# Patient Record
Sex: Female | Born: 1950 | Race: Black or African American | Hispanic: No | Smoking: Former smoker
Health system: Southern US, Community
[De-identification: ages and names within clinical notes are randomized; demographics above are authoritative.]

## PROBLEM LIST (undated history)

## (undated) DIAGNOSIS — I10 Essential (primary) hypertension: Secondary | ICD-10-CM

## (undated) DIAGNOSIS — K5792 Diverticulitis of intestine, part unspecified, without perforation or abscess without bleeding: Secondary | ICD-10-CM

## (undated) DIAGNOSIS — Z8619 Personal history of other infectious and parasitic diseases: Secondary | ICD-10-CM

## (undated) DIAGNOSIS — J45909 Unspecified asthma, uncomplicated: Secondary | ICD-10-CM

## (undated) DIAGNOSIS — E785 Hyperlipidemia, unspecified: Secondary | ICD-10-CM

## (undated) DIAGNOSIS — M1990 Unspecified osteoarthritis, unspecified site: Secondary | ICD-10-CM

## (undated) DIAGNOSIS — F32A Depression, unspecified: Secondary | ICD-10-CM

## (undated) HISTORY — DX: Essential (primary) hypertension: I10

## (undated) HISTORY — DX: Unspecified asthma, uncomplicated: J45.909

## (undated) HISTORY — DX: Unspecified osteoarthritis, unspecified site: M19.90

## (undated) HISTORY — DX: Personal history of other infectious and parasitic diseases: Z86.19

## (undated) HISTORY — PX: LUMBAR SPINE SURGERY: SHX701

## (undated) HISTORY — PX: ROTATOR CUFF REPAIR: SHX139

## (undated) HISTORY — PX: OTHER SURGICAL HISTORY: SHX169

## (undated) HISTORY — PX: REDUCTION MAMMAPLASTY: SUR839

## (undated) HISTORY — PX: COLECTOMY: SHX59

## (undated) HISTORY — DX: Hyperlipidemia, unspecified: E78.5

## (undated) HISTORY — DX: Depression, unspecified: F32.A

## (undated) HISTORY — PX: ABDOMINAL HYSTERECTOMY: SHX81

## (undated) HISTORY — PX: COLONOSCOPY WITH PROPOFOL: SHX5780

## (undated) HISTORY — DX: Diverticulitis of intestine, part unspecified, without perforation or abscess without bleeding: K57.92

---

## 2018-05-17 ENCOUNTER — Other Ambulatory Visit: Payer: Self-pay | Admitting: Physician Assistant

## 2018-05-17 DIAGNOSIS — E041 Nontoxic single thyroid nodule: Secondary | ICD-10-CM

## 2018-05-17 DIAGNOSIS — R928 Other abnormal and inconclusive findings on diagnostic imaging of breast: Secondary | ICD-10-CM

## 2020-01-17 ENCOUNTER — Other Ambulatory Visit: Payer: Self-pay

## 2020-01-17 ENCOUNTER — Ambulatory Visit: Payer: Self-pay | Attending: Internal Medicine

## 2020-01-17 DIAGNOSIS — Z23 Encounter for immunization: Secondary | ICD-10-CM | POA: Insufficient documentation

## 2020-01-17 NOTE — Progress Notes (Signed)
   Covid-19 Vaccination Clinic  Name:  Jesenia Spera    MRN: [id number] DOB: 07-12-1951  01/17/2020  Ms. Brackins was observed post Covid-19 immunization for 15 minutes without incidence. She was provided with Vaccine Information Sheet and instruction to access the V-Safe system.   Ms. Briones was instructed to call 911 with any severe reactions post vaccine: Marland Kitchen Difficulty breathing  . Swelling of your face and throat  . A fast heartbeat  . A bad rash all over your body  . Dizziness and weakness    Immunizations Administered    Name Date Dose VIS Date Route   Moderna COVID-19 Vaccine 01/17/2020 11:54 AM 0.5 mL 11/05/2019 Intramuscular   Manufacturer: Moderna   Lot: [id number]   NDC: 89842-103-12

## 2020-02-03 DIAGNOSIS — E785 Hyperlipidemia, unspecified: Secondary | ICD-10-CM | POA: Diagnosis not present

## 2020-02-03 DIAGNOSIS — Z0001 Encounter for general adult medical examination with abnormal findings: Secondary | ICD-10-CM | POA: Diagnosis not present

## 2020-02-03 DIAGNOSIS — G629 Polyneuropathy, unspecified: Secondary | ICD-10-CM | POA: Diagnosis not present

## 2020-02-03 DIAGNOSIS — Z23 Encounter for immunization: Secondary | ICD-10-CM | POA: Diagnosis not present

## 2020-02-03 DIAGNOSIS — I10 Essential (primary) hypertension: Secondary | ICD-10-CM | POA: Diagnosis not present

## 2020-02-03 DIAGNOSIS — M1711 Unilateral primary osteoarthritis, right knee: Secondary | ICD-10-CM | POA: Insufficient documentation

## 2020-02-03 DIAGNOSIS — E1169 Type 2 diabetes mellitus with other specified complication: Secondary | ICD-10-CM | POA: Insufficient documentation

## 2020-02-03 DIAGNOSIS — I152 Hypertension secondary to endocrine disorders: Secondary | ICD-10-CM | POA: Insufficient documentation

## 2020-02-03 DIAGNOSIS — F5101 Primary insomnia: Secondary | ICD-10-CM | POA: Diagnosis not present

## 2020-02-10 ENCOUNTER — Ambulatory Visit: Payer: Self-pay

## 2020-02-17 ENCOUNTER — Ambulatory Visit: Payer: Medicare HMO | Attending: Internal Medicine

## 2020-02-17 DIAGNOSIS — Z23 Encounter for immunization: Secondary | ICD-10-CM

## 2020-02-17 NOTE — Progress Notes (Signed)
   Covid-19 Vaccination Clinic  Name:  Judy Porter    MRN: [id number] DOB: Jun 03, 1951  02/17/2020  [name] was observed post Covid-19 immunization for 15 minutes without incident. She was provided with Vaccine Information Sheet and instruction to access the V-Safe system.   [name] was instructed to call 911 with any severe reactions post vaccine: Marland Kitchen Difficulty breathing  . Swelling of face and throat  . A fast heartbeat  . A bad rash all over body  . Dizziness and weakness   Immunizations Administered    Name Date Dose VIS Date Route   Moderna COVID-19 Vaccine 02/17/2020 11:16 AM 0.5 mL 11/05/2019 Intramuscular   Manufacturer: Moderna   Lot: [id number]   NDC: 08022-336-12

## 2020-02-21 ENCOUNTER — Other Ambulatory Visit: Payer: Self-pay | Admitting: Orthopedic Surgery

## 2020-02-21 DIAGNOSIS — M1712 Unilateral primary osteoarthritis, left knee: Secondary | ICD-10-CM

## 2020-02-21 DIAGNOSIS — M84452A Pathological fracture, left femur, initial encounter for fracture: Secondary | ICD-10-CM | POA: Diagnosis not present

## 2020-02-21 DIAGNOSIS — M25562 Pain in left knee: Secondary | ICD-10-CM | POA: Diagnosis not present

## 2020-03-23 ENCOUNTER — Other Ambulatory Visit: Payer: Self-pay

## 2020-03-23 ENCOUNTER — Other Ambulatory Visit: Payer: Self-pay | Admitting: Orthopedic Surgery

## 2020-03-23 ENCOUNTER — Ambulatory Visit
Admission: RE | Admit: 2020-03-23 | Discharge: 2020-03-23 | Disposition: A | Discharge status: Home / Self Care | Payer: PPO | Source: Ambulatory Visit | Attending: Orthopedic Surgery | Admitting: Orthopedic Surgery

## 2020-03-23 DIAGNOSIS — M1712 Unilateral primary osteoarthritis, left knee: Secondary | ICD-10-CM

## 2020-03-23 DIAGNOSIS — M84452A Pathological fracture, left femur, initial encounter for fracture: Secondary | ICD-10-CM

## 2020-04-29 ENCOUNTER — Encounter
Admission: RE | Admit: 2020-04-29 | Discharge: 2020-04-29 | Disposition: A | Discharge status: Home / Self Care | Payer: PPO | Source: Ambulatory Visit | Attending: Orthopedic Surgery | Admitting: Orthopedic Surgery

## 2020-04-29 ENCOUNTER — Other Ambulatory Visit: Payer: Self-pay

## 2020-04-29 DIAGNOSIS — M1712 Unilateral primary osteoarthritis, left knee: Secondary | ICD-10-CM | POA: Diagnosis not present

## 2020-04-29 DIAGNOSIS — M84452A Pathological fracture, left femur, initial encounter for fracture: Secondary | ICD-10-CM

## 2020-04-29 DIAGNOSIS — M25562 Pain in left knee: Secondary | ICD-10-CM | POA: Diagnosis not present

## 2020-04-29 MED ORDER — TECHNETIUM TC 99M MEDRONATE IV KIT
20.0000 | PACK | Freq: Once | INTRAVENOUS | Status: AC | PRN
  Administered 2020-04-29: 23.55 via INTRAVENOUS

## 2020-05-27 DIAGNOSIS — M84452A Pathological fracture, left femur, initial encounter for fracture: Secondary | ICD-10-CM | POA: Diagnosis not present

## 2020-05-27 DIAGNOSIS — M1712 Unilateral primary osteoarthritis, left knee: Secondary | ICD-10-CM | POA: Diagnosis not present

## 2020-08-27 DIAGNOSIS — F39 Unspecified mood [affective] disorder: Secondary | ICD-10-CM | POA: Insufficient documentation

## 2020-08-27 DIAGNOSIS — F331 Major depressive disorder, recurrent, moderate: Secondary | ICD-10-CM | POA: Insufficient documentation

## 2022-02-01 DIAGNOSIS — I10 Essential (primary) hypertension: Secondary | ICD-10-CM | POA: Diagnosis not present

## 2022-02-01 DIAGNOSIS — R0602 Shortness of breath: Secondary | ICD-10-CM | POA: Diagnosis not present

## 2022-02-01 DIAGNOSIS — F331 Major depressive disorder, recurrent, moderate: Secondary | ICD-10-CM | POA: Diagnosis not present

## 2022-05-14 IMAGING — CT NM BONE W/ SPECT
1 series · 12 of 14 positions shown, 15 images · non-contrast
Comparison: None.

CLINICAL DATA: LEFT knee pain for several years

EXAM:
NM BONE SCAN AND SPECT IMAGING
TECHNIQUE: After intravenous injection of radiopharmaceutical, delayed planar
images were obtained in multiple projections. Additionally, delayed
triplanar SPECT images were obtained through the area of interest.
RADIOPHARMACEUTICALS:  23.6 mCi Gc-VVm MDP

[Series 4: 3d bone 1.25 b70s · axial · 0.98mm/px · z∈[+646,+974]mm · 12 of 554 slices shown, 15 images]
[im 43/554  soft-tissue]
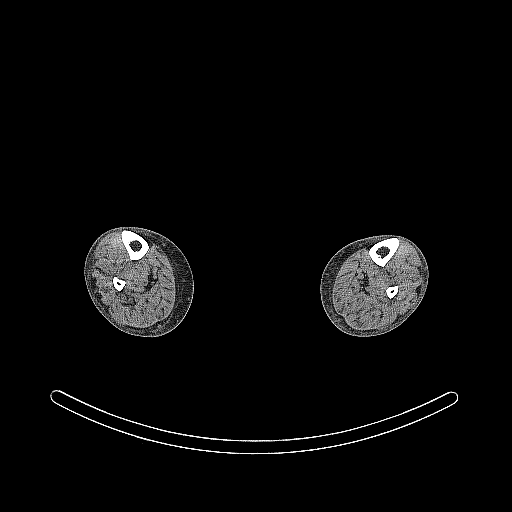
[im 43/554  bone]
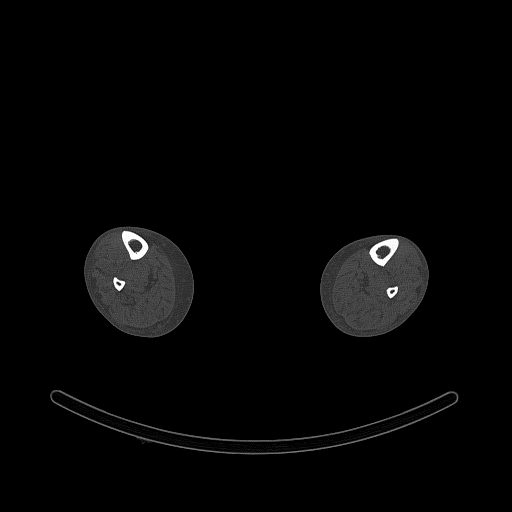
[im 86/554  bone]
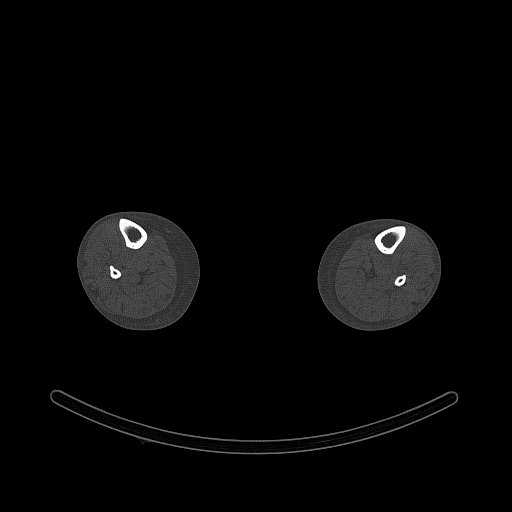
[im 128/554  bone]
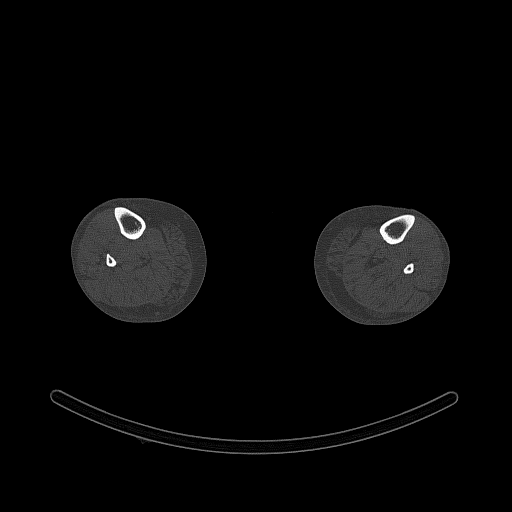
[im 171/554  bone]
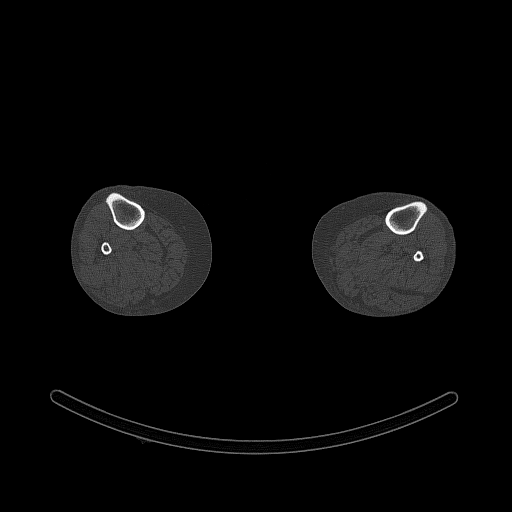
[im 213/554  soft-tissue]
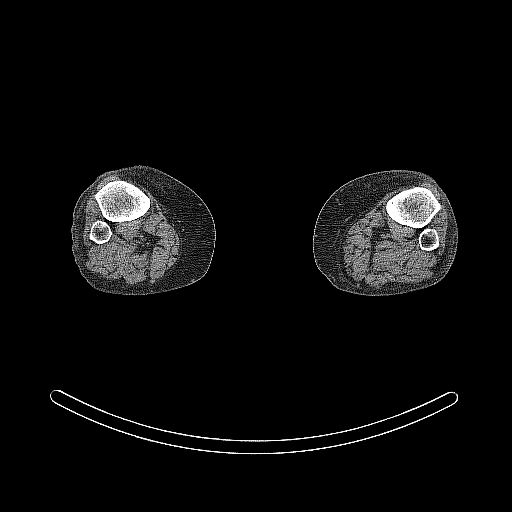
[im 213/554  bone]
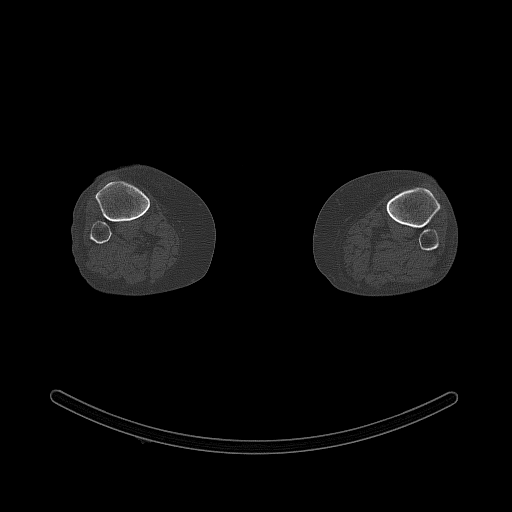
[im 256/554  bone]
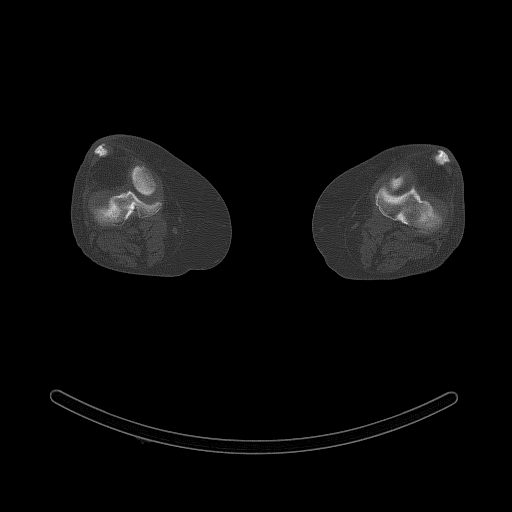
[im 298/554  bone]
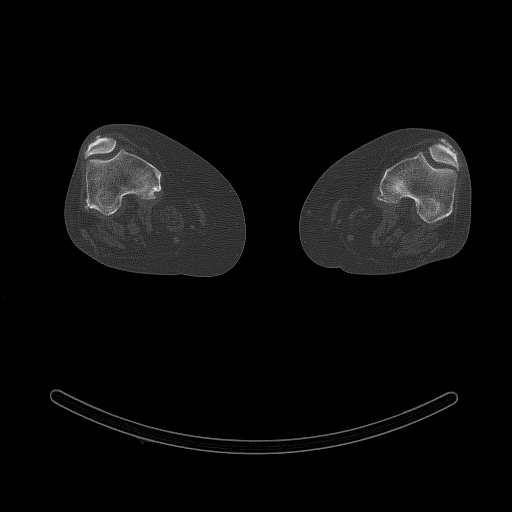
[im 341/554  bone]
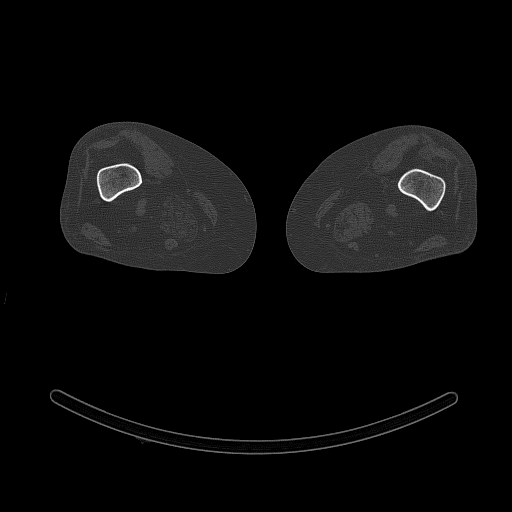
[im 383/554  soft-tissue]
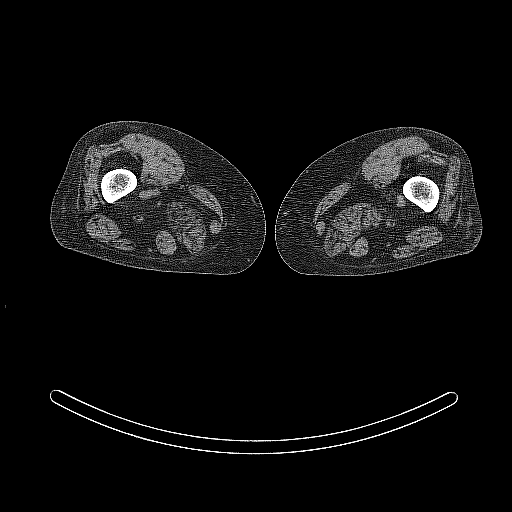
[im 383/554  bone]
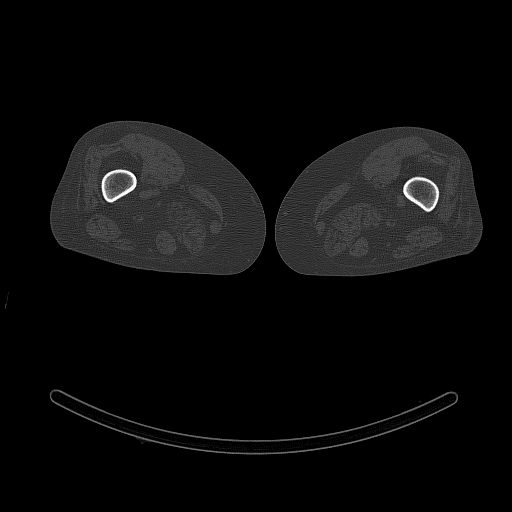
[im 426/554  bone]
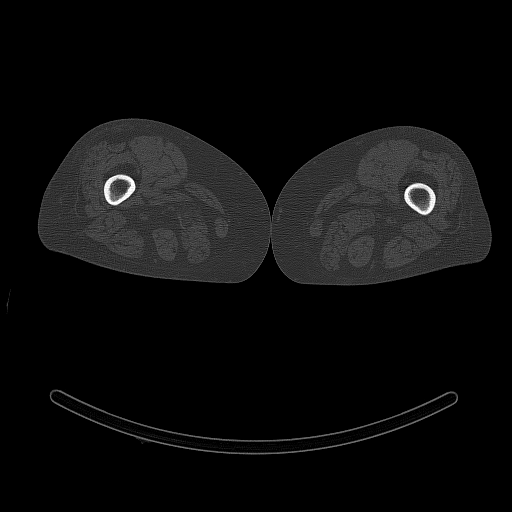
[im 468/554  bone]
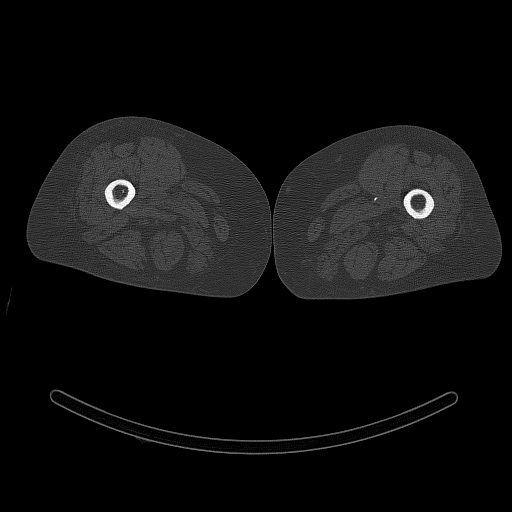
[im 511/554  bone]
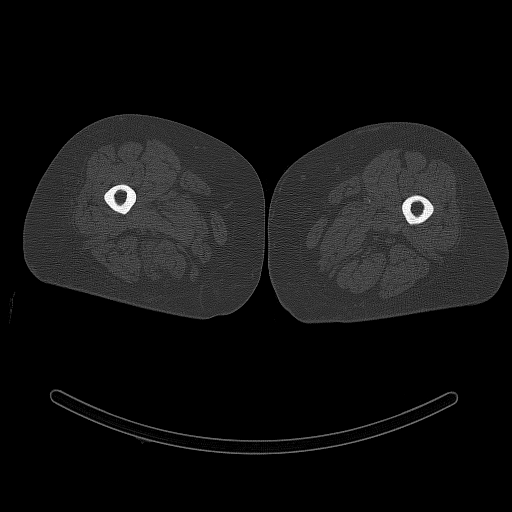

[12 of 14 positions shown; findings below may reference images not displayed]

FINDINGS: Mild uptake in the medial compartment of the LEFT knee on the
immediate blood pool phase imaging.

Delayed phase imaging, there is intense radiotracer uptake within
the medial compartment. On the CT portion exam, there is bone on
bone approximation in the medial compartment with loss of joint
space. The most intense uptake is in the femoral condyle medially.
There is subchondral cystic change at this level.
IMPRESSION: Severe loss of joint space within the medial compartment LEFT knee
with bone on bone approximation. Subchondral cystic change with
intense uptake in the LEFT medial femoral condyle. Consider MRI of
the knee.

## 2022-05-31 DIAGNOSIS — M25512 Pain in left shoulder: Secondary | ICD-10-CM | POA: Diagnosis not present

## 2022-07-12 DIAGNOSIS — E785 Hyperlipidemia, unspecified: Secondary | ICD-10-CM | POA: Diagnosis not present

## 2022-07-12 DIAGNOSIS — G629 Polyneuropathy, unspecified: Secondary | ICD-10-CM | POA: Diagnosis not present

## 2022-07-12 DIAGNOSIS — Z0001 Encounter for general adult medical examination with abnormal findings: Secondary | ICD-10-CM | POA: Diagnosis not present

## 2022-07-12 DIAGNOSIS — F331 Major depressive disorder, recurrent, moderate: Secondary | ICD-10-CM | POA: Diagnosis not present

## 2022-07-12 DIAGNOSIS — I10 Essential (primary) hypertension: Secondary | ICD-10-CM | POA: Diagnosis not present

## 2022-07-12 DIAGNOSIS — F5101 Primary insomnia: Secondary | ICD-10-CM | POA: Diagnosis not present

## 2022-07-12 DIAGNOSIS — M25512 Pain in left shoulder: Secondary | ICD-10-CM | POA: Diagnosis not present

## 2022-07-12 DIAGNOSIS — R739 Hyperglycemia, unspecified: Secondary | ICD-10-CM | POA: Diagnosis not present

## 2022-07-12 DIAGNOSIS — R7303 Prediabetes: Secondary | ICD-10-CM | POA: Diagnosis not present

## 2023-01-02 DIAGNOSIS — R739 Hyperglycemia, unspecified: Secondary | ICD-10-CM | POA: Diagnosis not present

## 2023-01-09 DIAGNOSIS — E785 Hyperlipidemia, unspecified: Secondary | ICD-10-CM | POA: Diagnosis not present

## 2023-01-09 DIAGNOSIS — G5602 Carpal tunnel syndrome, left upper limb: Secondary | ICD-10-CM | POA: Diagnosis not present

## 2023-01-09 DIAGNOSIS — R7303 Prediabetes: Secondary | ICD-10-CM

## 2023-01-09 DIAGNOSIS — F331 Major depressive disorder, recurrent, moderate: Secondary | ICD-10-CM | POA: Diagnosis not present

## 2023-01-09 DIAGNOSIS — Z0000 Encounter for general adult medical examination without abnormal findings: Secondary | ICD-10-CM | POA: Diagnosis not present

## 2023-01-09 DIAGNOSIS — I10 Essential (primary) hypertension: Secondary | ICD-10-CM | POA: Diagnosis not present

## 2023-01-09 DIAGNOSIS — G629 Polyneuropathy, unspecified: Secondary | ICD-10-CM | POA: Diagnosis not present

## 2023-01-09 DIAGNOSIS — F5101 Primary insomnia: Secondary | ICD-10-CM | POA: Diagnosis not present

## 2023-01-09 HISTORY — DX: Prediabetes: R73.03

## 2023-01-16 DIAGNOSIS — R202 Paresthesia of skin: Secondary | ICD-10-CM | POA: Diagnosis not present

## 2023-01-16 DIAGNOSIS — G5603 Carpal tunnel syndrome, bilateral upper limbs: Secondary | ICD-10-CM | POA: Diagnosis not present

## 2023-01-16 DIAGNOSIS — R200 Anesthesia of skin: Secondary | ICD-10-CM | POA: Diagnosis not present

## 2023-03-26 DIAGNOSIS — M1611 Unilateral primary osteoarthritis, right hip: Secondary | ICD-10-CM | POA: Diagnosis not present

## 2023-04-03 DIAGNOSIS — M1611 Unilateral primary osteoarthritis, right hip: Secondary | ICD-10-CM | POA: Diagnosis not present

## 2023-05-17 DIAGNOSIS — M1611 Unilateral primary osteoarthritis, right hip: Secondary | ICD-10-CM | POA: Diagnosis not present

## 2023-07-17 ENCOUNTER — Other Ambulatory Visit: Payer: Self-pay | Admitting: Internal Medicine

## 2023-07-17 DIAGNOSIS — Z1231 Encounter for screening mammogram for malignant neoplasm of breast: Secondary | ICD-10-CM

## 2023-09-08 ENCOUNTER — Ambulatory Visit (INDEPENDENT_AMBULATORY_CARE_PROVIDER_SITE_OTHER): Payer: Medicare (Managed Care) | Admitting: Family Medicine

## 2023-09-08 ENCOUNTER — Encounter: Payer: Self-pay | Admitting: Family Medicine

## 2023-09-08 VITALS — BP 162/82 | HR 74 | Temp 97.9°F | Resp 16 | Ht 66.0 in | Wt 283.4 lb

## 2023-09-08 DIAGNOSIS — M1711 Unilateral primary osteoarthritis, right knee: Secondary | ICD-10-CM

## 2023-09-08 DIAGNOSIS — F39 Unspecified mood [affective] disorder: Secondary | ICD-10-CM | POA: Diagnosis not present

## 2023-09-08 DIAGNOSIS — I10 Essential (primary) hypertension: Secondary | ICD-10-CM | POA: Diagnosis not present

## 2023-09-08 DIAGNOSIS — F5101 Primary insomnia: Secondary | ICD-10-CM

## 2023-09-08 DIAGNOSIS — Z23 Encounter for immunization: Secondary | ICD-10-CM | POA: Diagnosis not present

## 2023-09-08 DIAGNOSIS — E559 Vitamin D deficiency, unspecified: Secondary | ICD-10-CM

## 2023-09-08 DIAGNOSIS — Z1211 Encounter for screening for malignant neoplasm of colon: Secondary | ICD-10-CM

## 2023-09-08 DIAGNOSIS — E538 Deficiency of other specified B group vitamins: Secondary | ICD-10-CM

## 2023-09-08 DIAGNOSIS — Z1231 Encounter for screening mammogram for malignant neoplasm of breast: Secondary | ICD-10-CM

## 2023-09-08 DIAGNOSIS — Z1159 Encounter for screening for other viral diseases: Secondary | ICD-10-CM

## 2023-09-08 DIAGNOSIS — G629 Polyneuropathy, unspecified: Secondary | ICD-10-CM

## 2023-09-08 DIAGNOSIS — Z780 Asymptomatic menopausal state: Secondary | ICD-10-CM

## 2023-09-08 DIAGNOSIS — E785 Hyperlipidemia, unspecified: Secondary | ICD-10-CM

## 2023-09-08 DIAGNOSIS — R7309 Other abnormal glucose: Secondary | ICD-10-CM

## 2023-09-08 DIAGNOSIS — J45909 Unspecified asthma, uncomplicated: Secondary | ICD-10-CM

## 2023-09-08 NOTE — Patient Instructions (Signed)
It was a pleasure meeting you today. Thank you for allowing me to take part in your health care.  Our goals for today as we discussed include:  We will get some labs today.  If they are abnormal or we need to do something about them, I will call you.  If they are normal, I will send you a message on MyChart (if it is active) or a letter in the mail.  If you don't hear from [place] in 2 weeks, please call the office at the number below.   Follow up in 2 weeks  Flu vaccine today  Pneumonia 20 vaccine today  Referral sent for Mammogram and Dexa scan. Please call to schedule appointment. [facility] [address] [place], [place] [address] [phone]      If you have any questions or concerns, please do not hesitate to call the office at [phone].  I look forward to our next visit and until then take care and stay safe.  Regards,   Dana Allan, MD   [facility]

## 2023-09-09 LAB — COMPREHENSIVE METABOLIC PANEL
AG Ratio: 1.7 (calc) (ref 1.0–2.5)
ALT: 9 U/L (ref 6–29)
AST: 13 U/L (ref 10–35)
Albumin: 4.5 g/dL (ref 3.6–5.1)
Alkaline phosphatase (APISO): 63 U/L (ref 37–153)
BUN/Creatinine Ratio: 24 (calc) — ABNORMAL HIGH (ref 6–22)
BUN: 26 mg/dL — ABNORMAL HIGH (ref 7–25)
CO2: 28 mmol/L (ref 20–32)
Calcium: 10.2 mg/dL (ref 8.6–10.4)
Chloride: 104 mmol/L (ref 98–110)
Creat: 1.08 mg/dL — ABNORMAL HIGH (ref 0.60–1.00)
Globulin: 2.7 g/dL (ref 1.9–3.7)
Glucose, Bld: 84 mg/dL (ref 65–99)
Potassium: 4.6 mmol/L (ref 3.5–5.3)
Sodium: 144 mmol/L (ref 135–146)
Total Bilirubin: 0.5 mg/dL (ref 0.2–1.2)
Total Protein: 7.2 g/dL (ref 6.1–8.1)

## 2023-09-09 LAB — CBC WITH DIFFERENTIAL/PLATELET
Absolute Monocytes: 630 {cells}/uL (ref 200–950)
Basophils Absolute: 54 {cells}/uL (ref 0–200)
Basophils Relative: 0.6 %
Eosinophils Absolute: 144 {cells}/uL (ref 15–500)
Eosinophils Relative: 1.6 %
HCT: 40.7 % (ref 35.0–45.0)
Hemoglobin: 13.1 g/dL (ref 11.7–15.5)
Lymphs Abs: 2502 {cells}/uL (ref 850–3900)
MCH: 29.9 pg (ref 27.0–33.0)
MCHC: 32.2 g/dL (ref 32.0–36.0)
MCV: 92.9 fL (ref 80.0–100.0)
MPV: 11.7 fL (ref 7.5–12.5)
Monocytes Relative: 7 %
Neutro Abs: 5670 {cells}/uL (ref 1500–7800)
Neutrophils Relative %: 63 %
Platelets: 268 10*3/uL (ref 140–400)
RBC: 4.38 10*6/uL (ref 3.80–5.10)
RDW: 12.9 % (ref 11.0–15.0)
Total Lymphocyte: 27.8 %
WBC: 9 10*3/uL (ref 3.8–10.8)

## 2023-09-09 LAB — TSH: TSH: 1.8 m[IU]/L (ref 0.40–4.50)

## 2023-09-09 LAB — LIPID PANEL
Cholesterol: 192 mg/dL (ref <200)
HDL: 83 mg/dL (ref >=50)
LDL Cholesterol (Calc): 88 mg/dL
Non-HDL Cholesterol (Calc): 109 mg/dL (ref <130)
Total CHOL/HDL Ratio: 2.3 (calc) (ref <5.0)
Triglycerides: 118 mg/dL (ref <150)

## 2023-09-09 LAB — HEMOGLOBIN A1C
Hgb A1c MFr Bld: 6.2 %{Hb} — ABNORMAL HIGH (ref <5.7)
Mean Plasma Glucose: 131 mg/dL
eAG (mmol/L): 7.3 mmol/L

## 2023-09-09 LAB — HEPATITIS C ANTIBODY: Hepatitis C Ab: NONREACTIVE

## 2023-09-09 LAB — VITAMIN D 25 HYDROXY (VIT D DEFICIENCY, FRACTURES): Vit D, 25-Hydroxy: 35 ng/mL (ref 30–100)

## 2023-09-09 LAB — VITAMIN B12: Vitamin B-12: 555 pg/mL (ref 200–1100)

## 2023-09-13 ENCOUNTER — Telehealth: Payer: Self-pay

## 2023-09-13 NOTE — Telephone Encounter (Signed)
PT requesting call back to schedule colonoscopy

## 2023-09-13 NOTE — Telephone Encounter (Signed)
Message left for patient to return my call.  

## 2023-09-14 ENCOUNTER — Telehealth: Payer: Self-pay | Admitting: *Deleted

## 2023-09-14 ENCOUNTER — Other Ambulatory Visit: Payer: Self-pay | Admitting: *Deleted

## 2023-09-14 DIAGNOSIS — Z1211 Encounter for screening for malignant neoplasm of colon: Secondary | ICD-10-CM

## 2023-09-14 MED ORDER — PEG 3350-KCL-NA BICARB-NACL 420 G PO SOLR: 4000.0000 mL | Freq: Once | ORAL | 0 refills | Status: AC

## 2023-09-14 NOTE — Telephone Encounter (Signed)
Colonoscopy schedule on 10/19/2023 with Dr Allegra Lai on 10/19/2023 at [facility]

## 2023-09-14 NOTE — Telephone Encounter (Signed)
Gastroenterology Pre-Procedure Review  Request Date: 10/19/2023 Requesting Physician: Dr. Allegra Lai  PATIENT REVIEW QUESTIONS: The patient responded to the following health history questions as indicated:    1. Are you having any GI issues? no 2. Do you have a personal history of Polyps? no 3. Do you have a family history of Colon Cancer or Polyps? no 4. Diabetes Mellitus? no 5. Joint replacements in the past 12 months?no 6. Major health problems in the past 3 months?no 7. Any artificial heart valves, MVP, or defibrillator?no    MEDICATIONS & ALLERGIES:    Patient reports the following regarding taking any anticoagulation/antiplatelet therapy:   Plavix, Coumadin, Eliquis, Xarelto, Lovenox, Pradaxa, Brilinta, or Effient? no Aspirin? yes (81 mg)  Patient confirms/reports the following medications:  Current Outpatient Medications  Medication Sig Dispense Refill   polyethylene glycol-electrolytes (NULYTELY) 420 g solution Take 4,000 mLs by mouth once for 1 dose. 4000 mL 0   albuterol (VENTOLIN HFA) 108 (90 Base) MCG/ACT inhaler Inhale 1 puff into the lungs every 6 (six) hours as needed.     aspirin EC 81 MG tablet Take 81 mg by mouth daily.     atorvastatin (LIPITOR) 20 MG tablet Take 1 tablet by mouth daily.     carvedilol (COREG) 6.25 MG tablet Take 6.25 mg by mouth 2 (two) times daily with a meal.     DULoxetine (CYMBALTA) 20 MG capsule Take 20 mg by mouth daily.     gabapentin (NEURONTIN) 400 MG capsule Take 400 mg by mouth 3 (three) times daily.     hydrochlorothiazide (HYDRODIURIL) 25 MG tablet Take 25 mg by mouth daily.     losartan (COZAAR) 100 MG tablet Take 1 tablet by mouth daily.     meloxicam (MOBIC) 15 MG tablet Take 15 mg by mouth daily.     traZODone (DESYREL) 50 MG tablet Take 50 mg by mouth at bedtime as needed for sleep.     No current facility-administered medications for this visit.    Patient confirms/reports the following allergies:  Allergies  Allergen  Reactions   Elemental Sulfur Hives   Sulfa Antibiotics Hives    No orders of the defined types were placed in this encounter.   AUTHORIZATION INFORMATION Primary Insurance: 1D#: Group #:  Secondary Insurance: 1D#: Group #:  SCHEDULE INFORMATION: Date: 10/19/2023 Time: Location:  [facility]

## 2023-09-16 ENCOUNTER — Encounter: Payer: Self-pay | Admitting: Family Medicine

## 2023-09-16 DIAGNOSIS — J45909 Unspecified asthma, uncomplicated: Secondary | ICD-10-CM | POA: Insufficient documentation

## 2023-09-16 DIAGNOSIS — Z1231 Encounter for screening mammogram for malignant neoplasm of breast: Secondary | ICD-10-CM | POA: Insufficient documentation

## 2023-09-16 DIAGNOSIS — E559 Vitamin D deficiency, unspecified: Secondary | ICD-10-CM | POA: Insufficient documentation

## 2023-09-16 DIAGNOSIS — Z780 Asymptomatic menopausal state: Secondary | ICD-10-CM | POA: Insufficient documentation

## 2023-09-16 DIAGNOSIS — Z1211 Encounter for screening for malignant neoplasm of colon: Secondary | ICD-10-CM | POA: Insufficient documentation

## 2023-09-16 DIAGNOSIS — E538 Deficiency of other specified B group vitamins: Secondary | ICD-10-CM | POA: Insufficient documentation

## 2023-09-16 DIAGNOSIS — Z23 Encounter for immunization: Secondary | ICD-10-CM | POA: Insufficient documentation

## 2023-09-16 DIAGNOSIS — Z1159 Encounter for screening for other viral diseases: Secondary | ICD-10-CM | POA: Insufficient documentation

## 2023-09-16 DIAGNOSIS — R7309 Other abnormal glucose: Secondary | ICD-10-CM | POA: Insufficient documentation

## 2023-09-16 NOTE — Assessment & Plan Note (Signed)
On Losartan 100mg  and Carvedilol 6.25mg  BID. Blood pressure still elevated, potentially due to pain. -Continue current medications and monitor blood pressure. -Check labs today -Consider addition of CCB at next visit if remains >140/90

## 2023-09-16 NOTE — Assessment & Plan Note (Addendum)
On Duloxetine 20mg  since 2018. Reports improvement but still has some symptoms.  -Consider increasing Duloxetine dose after reviewing labs.

## 2023-09-16 NOTE — Assessment & Plan Note (Signed)
Reports severe hip arthritis and knee pain. Currently on Meloxicam 15 mg daily. -Consider decreasing Meloxicam to 7.5mg  daily or switching to Naproxen.

## 2023-09-16 NOTE — Assessment & Plan Note (Signed)
On Atorvastatin 20mg . -Continue current medication. -Check fasting lipids

## 2023-09-16 NOTE — Assessment & Plan Note (Signed)
On Trazodone 50mg  at night. Reports it helps after a while. -Continue current medication.

## 2023-09-16 NOTE — Assessment & Plan Note (Signed)
Currently on Gabapentin 400mg  TID  Expressed dissatisfaction with Gabapentin. -Continue current dose Gabapentin -Consider increasing Duloxetine dose, which may also help with pain.

## 2023-09-16 NOTE — Assessment & Plan Note (Signed)
Long-standing diagnosis, exacerbated in winter. Currently on Albuterol inhaler. Discussed potential benefits of switching to Airsupra, which contains an inhaled corticosteroid. -Consider switching to [place] depending on insurance coverage.

## 2023-09-16 NOTE — Progress Notes (Signed)
SUBJECTIVE:   Chief Complaint  Patient presents with   Establish Care   HPI Patient presents to establish care   Discussed the use of AI scribe software for clinical note transcription with the patient, who gave verbal consent to proceed.  History of Present Illness The patient, a 72 year old individual with a history of asthma, hypertension, and severe hip arthritis, presents for a new primary care consultation. She relocated to [place] in 2016 due to her late husband's health issues and has been living alone with her younger son since her husband's passing in 2018.  The patient reports a long-standing history of asthma, first diagnosed around the age of 45 when she experienced severe difficulty breathing. She currently uses an albuterol inhaler, with increased usage during the winter months due to exacerbated symptoms.  The patient also reports chronic pain, which has been particularly severe in the hip due to arthritis. She has received one of three ordered injections for pain management and is currently on gabapentin, which she reports as ineffective. The pain was previously so severe that it limited her mobility, but it has since improved to a more manageable level.  In addition to these issues, the patient has been on duloxetine since 2018 for depression, which began following her husband's death. She reports that the medication has not been particularly effective. She also has a history of carpal tunnel syndrome.  The patient has been on several medications for her various conditions, including losartan, hydrochlorothiazide, carvedilol, atorvastatin, and meloxicam. She expresses a desire to review and potentially change her current medication regimen, particularly the gabapentin, due to perceived lack of efficacy.  The patient also expresses concern about her weight and is interested in exploring options for weight loss. She has previously been on Ozempic, but the  prescription was discontinued.  The patient has a history of regular preventive care, including mammograms and colonoscopies, but has not had these screenings since relocating to [place]. She expresses a willingness to undergo these screenings again. She also reports no known reactions to aspirin, which she has been taking for an extended period as a preventive measure.  In summary, this is a 72 year old patient with a history of asthma, hypertension, severe hip arthritis, depression, and carpal tunnel syndrome, presenting for a new primary care consultation. She expresses concerns about her current medication regimen, chronic pain management, and weight, and is open to preventive screenings and potential changes in her treatment plan.    PERTINENT PMH / PSH: As Above  OBJECTIVE:  BP (!) 162/82   Pulse 74   Temp 97.9 F (36.6 C)   Resp 16   Ht 5\' 6"  (1.676 m)   Wt 283 lb 6 oz (128.5 kg)   LMP  (LMP Unknown)   SpO2 94%   BMI 45.74 kg/m    Physical Exam Vitals reviewed.  Constitutional:      General: She is not in acute distress.    Appearance: She is not ill-appearing.  HENT:     Head: Normocephalic.     Right Ear: Tympanic membrane, ear canal and external ear normal.     Left Ear: Tympanic membrane, ear canal and external ear normal.     Nose: Nose normal.     Mouth/Throat:     Mouth: Mucous membranes are moist.  Eyes:     Extraocular Movements: Extraocular movements intact.     Conjunctiva/sclera: Conjunctivae normal.     Pupils: Pupils are equal, round, and reactive to light.  Neck:     Thyroid: No thyromegaly or thyroid tenderness.     Vascular: No carotid bruit.  Cardiovascular:     Rate and Rhythm: Normal rate and regular rhythm.     Pulses: Normal pulses.     Heart sounds: Normal heart sounds.  Pulmonary:     Effort: Pulmonary effort is normal.     Breath sounds: Normal breath sounds.  Abdominal:     General: Bowel sounds are normal. There is no  distension.     Palpations: Abdomen is soft.     Tenderness: There is no abdominal tenderness. There is no right CVA tenderness, left CVA tenderness, guarding or rebound.  Musculoskeletal:        General: Normal range of motion.     Cervical back: Normal range of motion.     Right lower leg: No edema.     Left lower leg: No edema.  Lymphadenopathy:     Cervical: No cervical adenopathy.  Skin:    Capillary Refill: Capillary refill takes less than 2 seconds.  Neurological:     General: No focal deficit present.     Mental Status: She is alert and oriented to person, place, and time. Mental status is at baseline.     Motor: No weakness.  Psychiatric:        Mood and Affect: Mood normal.        Behavior: Behavior normal.        Thought Content: Thought content normal.        Judgment: Judgment normal.        09/08/2023    2:20 PM  Depression screen PHQ 2/9  Decreased Interest 1  Down, Depressed, Hopeless 0  PHQ - 2 Score 1  Altered sleeping 0  Tired, decreased energy 3  Change in appetite 0  Feeling bad or failure about yourself  0  Trouble concentrating 0  Moving slowly or fidgety/restless 0  Suicidal thoughts 0  PHQ-9 Score 4  Difficult doing work/chores Somewhat difficult      09/08/2023    2:20 PM  GAD 7 : Generalized Anxiety Score  Nervous, Anxious, on Edge 0  Control/stop worrying 0  Worry too much - different things 2  Trouble relaxing 0  Restless 0  Easily annoyed or irritable 2  Afraid - awful might happen 0  Total GAD 7 Score 4  Anxiety Difficulty Somewhat difficult    ASSESSMENT/PLAN:  Essential hypertension Assessment & Plan: On Losartan 100mg  and Carvedilol 6.25mg  BID. Blood pressure still elevated, potentially due to pain. -Continue current medications and monitor blood pressure. -Check labs today -Consider addition of CCB at next visit if remains >140/90   Orders: -     Comprehensive metabolic panel  Need for influenza vaccination -      Flu Vaccine Trivalent High Dose (Fluad)  Morbid obesity (HCC) -     CBC with Differential/Platelet -     TSH  Mood disorder (HCC) Assessment & Plan: On Duloxetine 20mg  since 2018. Reports improvement but still has some symptoms.  -Consider increasing Duloxetine dose after reviewing labs.    Colon cancer screening -     Ambulatory referral to Gastroenterology  Need for hepatitis C screening test -     Hepatitis C antibody  Breast cancer screening by mammogram -     3D Screening Mammogram, Left and Right  Postmenopausal estrogen deficiency -     DG Bone Density  Abnormal glucose -     Hemoglobin A1c  Hyperlipidemia, unspecified hyperlipidemia type Assessment & Plan: On Atorvastatin 20mg . -Continue current medication. -Check fasting lipids  Orders: -     Lipid panel  Vitamin D deficiency -     VITAMIN D 25 Hydroxy (Vit-D Deficiency, Fractures)  Vitamin B 12 deficiency -     Vitamin B12  Encounter for immunization -     Pneumococcal conjugate vaccine 20-valent  Primary insomnia Assessment & Plan: On Trazodone 50mg  at night. Reports it helps after a while. -Continue current medication.   Neuropathy Assessment & Plan: Currently on Gabapentin 400mg  TID  Expressed dissatisfaction with Gabapentin. -Continue current dose Gabapentin -Consider increasing Duloxetine dose, which may also help with pain.   Osteoarthritis of right knee, unspecified osteoarthritis type Assessment & Plan: Reports severe hip arthritis and knee pain. Currently on Meloxicam 15 mg daily. -Consider decreasing Meloxicam to 7.5mg  daily or switching to Naproxen.    Uncomplicated asthma, unspecified asthma severity, unspecified whether persistent Assessment & Plan: Long-standing diagnosis, exacerbated in winter. Currently on Albuterol inhaler. Discussed potential benefits of switching to Airsupra, which contains an inhaled corticosteroid. -Consider switching to [place] depending on  insurance coverage.     PDMP reviewed  Return in about 2 weeks (around 09/22/2023) for PCP.  Dana Allan, MD

## 2023-09-22 ENCOUNTER — Ambulatory Visit: Payer: Medicare (Managed Care) | Admitting: Family Medicine

## 2023-09-26 ENCOUNTER — Other Ambulatory Visit: Payer: Self-pay

## 2023-09-29 ENCOUNTER — Ambulatory Visit (INDEPENDENT_AMBULATORY_CARE_PROVIDER_SITE_OTHER): Payer: Medicare (Managed Care) | Admitting: Family Medicine

## 2023-09-29 ENCOUNTER — Encounter: Payer: Self-pay | Admitting: Family Medicine

## 2023-09-29 VITALS — BP 128/82 | HR 62 | Temp 98.0°F | Resp 16 | Ht 66.0 in | Wt 284.5 lb

## 2023-09-29 DIAGNOSIS — R7303 Prediabetes: Secondary | ICD-10-CM

## 2023-09-29 DIAGNOSIS — F39 Unspecified mood [affective] disorder: Secondary | ICD-10-CM

## 2023-09-29 DIAGNOSIS — Z1211 Encounter for screening for malignant neoplasm of colon: Secondary | ICD-10-CM

## 2023-09-29 DIAGNOSIS — G629 Polyneuropathy, unspecified: Secondary | ICD-10-CM | POA: Diagnosis not present

## 2023-09-29 DIAGNOSIS — I10 Essential (primary) hypertension: Secondary | ICD-10-CM

## 2023-09-29 DIAGNOSIS — J4520 Mild intermittent asthma, uncomplicated: Secondary | ICD-10-CM

## 2023-09-29 MED ORDER — TRAZODONE HCL 50 MG PO TABS
50.0000 mg | ORAL_TABLET | Freq: Every evening | ORAL | 3 refills | Status: DC | PRN
  Filled 2023-12-18: qty 90, 90d supply, fill #0
  Filled 2024-04-05: qty 90, 90d supply, fill #1

## 2023-09-29 MED ORDER — DULOXETINE HCL 20 MG PO CPEP: 20.0000 mg | ORAL_CAPSULE | Freq: Every day | ORAL | 3 refills | Status: DC

## 2023-09-29 MED ORDER — AIRSUPRA 90-80 MCG/ACT IN AERO: 1.0000 | INHALATION_SPRAY | RESPIRATORY_TRACT | Status: DC | PRN

## 2023-09-29 MED ORDER — AMLODIPINE BESYLATE 2.5 MG PO TABS: 2.5000 mg | ORAL_TABLET | Freq: Every day | ORAL | 0 refills | Status: DC

## 2023-09-29 MED ORDER — GABAPENTIN 400 MG PO CAPS
800.0000 mg | ORAL_CAPSULE | Freq: Two times a day (BID) | ORAL | 11 refills | Status: AC
  Filled 2023-12-18: qty 120, 30d supply, fill #0
  Filled 2024-02-19: qty 120, 30d supply, fill #1
  Filled 2024-04-05: qty 120, 30d supply, fill #2
  Filled 2024-07-03: qty 120, 30d supply, fill #3
  Filled 2024-08-02: qty 120, 30d supply, fill #4

## 2023-09-29 MED ORDER — AMLODIPINE BESYLATE 2.5 MG PO TABS: 2.5000 mg | ORAL_TABLET | Freq: Every evening | ORAL | 0 refills | Status: DC

## 2023-09-29 MED ORDER — DULOXETINE HCL 40 MG PO CPEP: 40.0000 mg | ORAL_CAPSULE | Freq: Every day | ORAL | 3 refills | Status: DC

## 2023-09-29 MED ORDER — METFORMIN HCL ER 500 MG PO TB24: 500.0000 mg | ORAL_TABLET | Freq: Every day | ORAL | 3 refills | Status: DC

## 2023-09-29 MED ORDER — CARVEDILOL 6.25 MG PO TABS
6.2500 mg | ORAL_TABLET | Freq: Two times a day (BID) | ORAL | 11 refills | Status: DC
  Filled 2023-12-18: qty 60, 30d supply, fill #0
  Filled 2024-02-19: qty 60, 30d supply, fill #1
  Filled 2024-04-05: qty 60, 30d supply, fill #2

## 2023-09-29 MED ORDER — MELOXICAM 7.5 MG PO TABS: 7.5000 mg | ORAL_TABLET | Freq: Every day | ORAL | 0 refills | Status: DC

## 2023-09-29 MED ORDER — LOSARTAN POTASSIUM 100 MG PO TABS
100.0000 mg | ORAL_TABLET | Freq: Every day | ORAL | 3 refills | Status: DC
  Filled 2023-12-18: qty 90, 90d supply, fill #0
  Filled 2024-04-05: qty 90, 90d supply, fill #1

## 2023-09-29 MED ORDER — HYDROCHLOROTHIAZIDE 25 MG PO TABS
25.0000 mg | ORAL_TABLET | Freq: Every day | ORAL | 3 refills | Status: DC
  Filled 2023-12-18: qty 90, 90d supply, fill #0
  Filled 2024-04-05: qty 90, 90d supply, fill #1

## 2023-09-29 MED ORDER — ATORVASTATIN CALCIUM 20 MG PO TABS
20.0000 mg | ORAL_TABLET | Freq: Every day | ORAL | 3 refills | Status: DC
  Filled 2023-12-18: qty 90, 90d supply, fill #0
  Filled 2024-04-05: qty 90, 90d supply, fill #1

## 2023-09-29 MED ORDER — AIRSUPRA 90-80 MCG/ACT IN AERO
1.0000 | INHALATION_SPRAY | RESPIRATORY_TRACT | 3 refills | Status: DC | PRN
  Filled 2023-12-18 – 2024-01-10 (×2): qty 10.7, 30d supply, fill #0

## 2023-09-29 MED ORDER — GABAPENTIN 400 MG PO CAPS: 400.0000 mg | ORAL_CAPSULE | Freq: Three times a day (TID) | ORAL | 11 refills | Status: DC

## 2023-09-29 MED ORDER — ALBUTEROL SULFATE HFA 108 (90 BASE) MCG/ACT IN AERS
1.0000 | INHALATION_SPRAY | Freq: Four times a day (QID) | RESPIRATORY_TRACT | 3 refills | Status: DC | PRN
  Filled 2023-12-19: qty 6.7, 20d supply, fill #0
  Filled 2024-04-05: qty 6.7, 20d supply, fill #1

## 2023-09-29 NOTE — Progress Notes (Unsigned)
SUBJECTIVE:   Chief Complaint  Patient presents with   Hypertension    2 week follow up    HPI Presents for follow up high blood pressure  Discussed the use of AI scribe software for clinical note transcription with the patient, who gave verbal consent to proceed.  History of Present Illness The patient, a 72 year old with a history of hypertension, asthma, and prediabetes, presents for a follow-up visit. The patient reports discontinuing aspirin and meloxicam as previously advised. The patient has been experiencing persistent hypertension, with recent readings of 128 and 140 over 88. The patient reports that her blood pressure tends to rise when she is in pain.  The patient's recent lab results indicate an A1C of 6.2, which is in the prediabetic range. The patient's kidney function was slightly elevated, which was attributed to fasting and medication use. The patient's cholesterol panel was within normal limits, and no changes to her current medication regimen were recommended based on these results.  The patient has been experiencing stiffness and pain, particularly in the mornings. The patient has been taking meloxicam and gabapentin for pain management, but reports no significant improvement in symptoms with these medications. The patient also reports that the neuropathy symptoms have not improved with gabapentin use.  The patient has a history of asthma and uses albuterol as needed, typically twice a week or when exposed to certain triggers. The patient reports that her asthma symptoms tend to worsen in the winter months.  The patient has a history of a hysterectomy and has been experiencing weight gain, which she believes is contributing to her breathing difficulties. The patient expresses a desire to lose weight and is open to starting metformin for weight management and prediabetes control.  The patient has a history of two colonoscopies, the most recent of which resulted in a  prolonged recovery period. The patient expresses significant anxiety about undergoing another colonoscopy and is considering alternative screening options.  The patient is compliant with her current medication regimen, which includes Cymbalta, trazodone, and albuterol. The patient reports feeling better since increasing the dose of Cymbalta to 40mg .    PERTINENT PMH / PSH: As above  OBJECTIVE:  BP 128/82   Pulse 62   Temp 98 F (36.7 C)   Resp 16   Ht 5\' 6"  (1.676 m)   Wt 284 lb 8 oz (129 kg)   LMP  (LMP Unknown)   SpO2 98%   BMI 45.92 kg/m    Physical Exam Vitals reviewed.  Constitutional:      General: She is not in acute distress.    Appearance: Normal appearance. She is obese. She is not ill-appearing, toxic-appearing or diaphoretic.  HENT:     Mouth/Throat:     Mouth: Mucous membranes are moist.  Eyes:     General:        Right eye: No discharge.        Left eye: No discharge.     Conjunctiva/sclera: Conjunctivae normal.  Cardiovascular:     Rate and Rhythm: Normal rate and regular rhythm.     Heart sounds: Normal heart sounds.  Pulmonary:     Effort: Pulmonary effort is normal.     Breath sounds: Normal breath sounds.  Abdominal:     General: Bowel sounds are normal.  Musculoskeletal:        General: Normal range of motion.  Skin:    General: Skin is warm and dry.  Neurological:     General: No focal  deficit present.     Mental Status: She is alert and oriented to person, place, and time. Mental status is at baseline.  Psychiatric:        Mood and Affect: Mood normal.        Behavior: Behavior normal.        Thought Content: Thought content normal.        Judgment: Judgment normal.        09/29/2023    1:54 PM 09/08/2023    2:20 PM  Depression screen PHQ 2/9  Decreased Interest 1 1  Down, Depressed, Hopeless 0 0  PHQ - 2 Score 1 1  Altered sleeping 1 0  Tired, decreased energy 2 3  Change in appetite 0 0  Feeling bad or failure about yourself   0 0  Trouble concentrating 0 0  Moving slowly or fidgety/restless 0 0  Suicidal thoughts 0 0  PHQ-9 Score 4 4  Difficult doing work/chores Not difficult at all Somewhat difficult      09/29/2023    1:55 PM 09/08/2023    2:20 PM  GAD 7 : Generalized Anxiety Score  Nervous, Anxious, on Edge 0 0  Control/stop worrying 0 0  Worry too much - different things 0 2  Trouble relaxing 1 0  Restless 0 0  Easily annoyed or irritable 1 2  Afraid - awful might happen 0 0  Total GAD 7 Score 2 4  Anxiety Difficulty Not difficult at all Somewhat difficult    ASSESSMENT/PLAN:  Essential hypertension Assessment & Plan: Blood pressure slightly elevated at 140/88. Patient reports that it increases with pain. Currently on hydrochlorothiazide. -Start amlodipine 2.5mg  at night - Discontinue meloxicam. - Continue hydrochlorothiazide 25 mg daily - Continue Carvedilol 6.5 mg BID   Orders: -     amLODIPine Besylate; Take 1 tablet (2.5 mg total) by mouth at bedtime.  Dispense: 90 tablet; Refill: 0  Prediabetes Assessment & Plan: A1C at 6.2, stable from previous year. Patient is not on any diabetic medications and has no history of A1C above 6.5. Patient expresses desire to lose weight. -Start metformin XR 500mg  daily to assist with weight loss and monitor for gastrointestinal side effects. -Encourage low glycemic index diet and moderation of carbohydrate intake.  Orders: -     metFORMIN HCl ER; Take 1 tablet (500 mg total) by mouth daily with breakfast.  Dispense: 90 tablet; Refill: 3  Neuropathy Assessment & Plan: Patient on meloxicam 15mg  daily, which is being discontinued due to concerns about blood pressure. Patient also on gabapentin 400mg  three times daily and duloxetine 40mg  daily for pain management. -Discontinue meloxicam. -Increase gabapentin to 800mg  twice daily. -Continue duloxetine 40mg  daily.  Orders: -     Gabapentin; Take 2 capsules (800 mg total) by mouth 2 (two) times daily.   Dispense: 120 capsule; Refill: 11  Mood disorder (HCC) Assessment & Plan: Managed with Duloxetine Self increased to 40 mg daily and reports having improvement in mood - Continue Duloxetine 40 mg daily - Recommend CBT - Mental health resources provided - Follow up as needed  Orders: -     DULoxetine HCl; Take 1 capsule (40 mg total) by mouth daily.  Dispense: 90 capsule; Refill: 3  Colon cancer screening -     Cologuard; Future  Mild intermittent asthma without complication Assessment & Plan: Patient uses albuterol inhaler as needed, approximately twice weekly. Reports increased symptoms in winter. -Discontinue albuterol. -Start Airsupra inhaler, up to 12 inhalations in 24 hours.  Orders: -     Airsupra; Inhale 1-2 puffs into the lungs every 4 (four) hours as needed.  Dispense: 10.7 g; Refill: 3 -     Airsupra; Inhale 1-2 puffs into the lungs every 4 (four) hours as needed.  Dispense: 10.7 g    General Health Maintenance -Completed mammogram and bone density scan scheduled for November 30, 2023. -Completed flu vaccine at last visit. -Patient would prefer not to have colonoscopy. Order Cologuard test for colorectal cancer screening, to be repeated every 3 years if negative.   PDMP reviewed  Return in about 3 months (around 12/30/2023) for PCP.  Dana Allan, MD

## 2023-09-29 NOTE — Patient Instructions (Addendum)
It was a pleasure meeting you today. Thank you for allowing me to take part in your health care.  Our goals for today as we discussed include:  Increase gabapentin to 800 mg two times a day  Increase Duloxetine to 40 mg daily  Start Metformin XR 500 mg daily for prediabetes and help with weight loss  Stop Albuterol Start Airsupra 1-2 puffs every 4 hours for shortness of breath or wheezing.  This is for rescue therapy.  Do not take with Albuterol. Sample provided.  May not be covered under insurance but will try.  Start Amlodipine 2.5 mg at night for high blood pressure.  Stop Meloxicam.  This can increase blood pressure  Cologuard ordered.  Please follow directions when received.    This is a list of the screening recommended for you and due dates:  Health Maintenance  Topic Date Due   DTaP/Tdap/Td vaccine (1 - Tdap) Never done   Cologuard (Stool DNA test)  Never done   Mammogram  Never done   Zoster (Shingles) Vaccine (1 of 2) Never done   DEXA scan (bone density measurement)  Never done   Medicare Annual Wellness Visit  02/02/2021   COVID-19 Vaccine (3 - 2023-24 season) 08/06/2023   Pneumonia Vaccine  Completed   Flu Shot  Completed   Hepatitis C Screening  Completed   HPV Vaccine  Aged Out     Follow up in 3 months   If you have any questions or concerns, please do not hesitate to call the office at [phone].  I look forward to our next visit and until then take care and stay safe.  Regards,   Dana Allan, MD   [facility]

## 2023-10-01 ENCOUNTER — Encounter: Payer: Self-pay | Admitting: Family Medicine

## 2023-10-01 NOTE — Assessment & Plan Note (Signed)
Patient on meloxicam 15mg  daily, which is being discontinued due to concerns about blood pressure. Patient also on gabapentin 400mg  three times daily and duloxetine 40mg  daily for pain management. -Discontinue meloxicam. -Increase gabapentin to 800mg  twice daily. -Continue duloxetine 40mg  daily.

## 2023-10-01 NOTE — Assessment & Plan Note (Signed)
Managed with Duloxetine Self increased to 40 mg daily and reports having improvement in mood - Continue Duloxetine 40 mg daily - Recommend CBT - Mental health resources provided - Follow up as needed

## 2023-10-01 NOTE — Assessment & Plan Note (Signed)
Blood pressure slightly elevated at 140/88. Patient reports that it increases with pain. Currently on hydrochlorothiazide. -Start amlodipine 2.5mg  at night - Discontinue meloxicam. - Continue hydrochlorothiazide 25 mg daily - Continue Carvedilol 6.5 mg BID

## 2023-10-01 NOTE — Assessment & Plan Note (Signed)
Patient uses albuterol inhaler as needed, approximately twice weekly. Reports increased symptoms in winter. -Discontinue albuterol. -Start Airsupra inhaler, up to 12 inhalations in 24 hours.

## 2023-10-01 NOTE — Assessment & Plan Note (Signed)
A1C at 6.2, stable from previous year. Patient is not on any diabetic medications and has no history of A1C above 6.5. Patient expresses desire to lose weight. -Start metformin XR 500mg  daily to assist with weight loss and monitor for gastrointestinal side effects. -Encourage low glycemic index diet and moderation of carbohydrate intake.

## 2023-10-04 ENCOUNTER — Telehealth: Payer: Self-pay | Admitting: Family Medicine

## 2023-10-04 NOTE — Telephone Encounter (Signed)
Vernona Rieger from [facility] called stating they need clarification on DULoxetine

## 2023-10-05 NOTE — Telephone Encounter (Signed)
Duloxetine 40 capsule is not covered but the 20,30,60 is cover and [employer]ivia [place] is not covered but  [employer] is covered.

## 2023-10-06 ENCOUNTER — Other Ambulatory Visit: Payer: Self-pay | Admitting: Family Medicine

## 2023-10-06 DIAGNOSIS — F39 Unspecified mood [affective] disorder: Secondary | ICD-10-CM

## 2023-10-06 MED ORDER — DULOXETINE HCL 20 MG PO CPEP
40.0000 mg | ORAL_CAPSULE | Freq: Every day | ORAL | 3 refills | Status: DC
  Filled 2023-12-18: qty 180, 90d supply, fill #0
  Filled 2024-04-05: qty 180, 90d supply, fill #1

## 2023-10-09 ENCOUNTER — Telehealth: Payer: Self-pay

## 2023-10-09 DIAGNOSIS — M1611 Unilateral primary osteoarthritis, right hip: Secondary | ICD-10-CM | POA: Insufficient documentation

## 2023-10-09 NOTE — Telephone Encounter (Signed)
Judy Porter states she is calling from [facility] to state they have received two prescriptions for the same medication:  gabapentin (NEURONTIN) 400 MG capsule, and both are dated 09/29/2023.  Judy Porter states one is for two caps twice a day, and the other is for one cap three times per day.  Judy Porter states she needs to know which one is correct.

## 2023-10-09 NOTE — Telephone Encounter (Signed)
Called pharmacy and gave them the right rx for pt.

## 2023-10-09 NOTE — Telephone Encounter (Signed)
Called pt and made her aware of the changes.

## 2023-10-11 ENCOUNTER — Telehealth: Payer: Self-pay

## 2023-10-11 NOTE — Telephone Encounter (Signed)
Called pt and she wanted to know if she can have something arthritis pain for the join. In the morning it hurts the most. She also has hip injection Oct 19, 2014 at [facility].

## 2023-10-12 ENCOUNTER — Encounter: Payer: Self-pay | Admitting: Gastroenterology

## 2023-10-16 ENCOUNTER — Other Ambulatory Visit: Payer: Self-pay | Admitting: Family Medicine

## 2023-10-16 DIAGNOSIS — M1711 Unilateral primary osteoarthritis, right knee: Secondary | ICD-10-CM

## 2023-10-16 MED ORDER — MELOXICAM 7.5 MG PO TABS
7.5000 mg | ORAL_TABLET | Freq: Every day | ORAL | 3 refills | Status: DC
  Filled 2023-12-15: qty 30, 30d supply, fill #0

## 2023-10-16 MED ORDER — OMEPRAZOLE 20 MG PO CPDR
20.0000 mg | DELAYED_RELEASE_CAPSULE | Freq: Every day | ORAL | 3 refills | Status: DC
  Filled 2023-12-18: qty 30, 30d supply, fill #0

## 2023-10-16 NOTE — Telephone Encounter (Signed)
Pt wanted to know if there is anything else to help with her arthritis? She is willing to do Meloxicam, but your last note you was worried about it rasing her bp. She threw her 15 MG away when you told her that. She wanted to know what your thoughts on her taking a lower dose Melociam.

## 2023-10-17 NOTE — Telephone Encounter (Signed)
Pt.notified

## 2023-10-19 ENCOUNTER — Ambulatory Visit: Payer: Medicare (Managed Care) | Admitting: Certified Registered Nurse Anesthetist

## 2023-10-19 ENCOUNTER — Ambulatory Visit
Admission: RE | Admit: 2023-10-19 | Discharge: 2023-10-19 | Disposition: A | Discharge status: Home / Self Care | Payer: Medicare (Managed Care) | Attending: Gastroenterology | Admitting: Gastroenterology

## 2023-10-19 ENCOUNTER — Encounter: Payer: Self-pay | Admitting: Gastroenterology

## 2023-10-19 ENCOUNTER — Encounter
Admission: RE | Disposition: A | Discharge status: Home / Self Care | Payer: Self-pay | Source: Home / Self Care | Attending: Gastroenterology

## 2023-10-19 DIAGNOSIS — D125 Benign neoplasm of sigmoid colon: Secondary | ICD-10-CM | POA: Diagnosis not present

## 2023-10-19 DIAGNOSIS — Z6841 Body Mass Index (BMI) 40.0 and over, adult: Secondary | ICD-10-CM | POA: Insufficient documentation

## 2023-10-19 DIAGNOSIS — K635 Polyp of colon: Secondary | ICD-10-CM | POA: Insufficient documentation

## 2023-10-19 DIAGNOSIS — D124 Benign neoplasm of descending colon: Secondary | ICD-10-CM | POA: Insufficient documentation

## 2023-10-19 DIAGNOSIS — K644 Residual hemorrhoidal skin tags: Secondary | ICD-10-CM | POA: Insufficient documentation

## 2023-10-19 DIAGNOSIS — Z1211 Encounter for screening for malignant neoplasm of colon: Secondary | ICD-10-CM | POA: Insufficient documentation

## 2023-10-19 DIAGNOSIS — D120 Benign neoplasm of cecum: Secondary | ICD-10-CM | POA: Insufficient documentation

## 2023-10-19 DIAGNOSIS — D123 Benign neoplasm of transverse colon: Secondary | ICD-10-CM | POA: Insufficient documentation

## 2023-10-19 DIAGNOSIS — Z87891 Personal history of nicotine dependence: Secondary | ICD-10-CM | POA: Insufficient documentation

## 2023-10-19 DIAGNOSIS — E6689 Other obesity not elsewhere classified: Secondary | ICD-10-CM | POA: Diagnosis not present

## 2023-10-19 DIAGNOSIS — I10 Essential (primary) hypertension: Secondary | ICD-10-CM | POA: Diagnosis not present

## 2023-10-19 DIAGNOSIS — F32A Depression, unspecified: Secondary | ICD-10-CM | POA: Diagnosis not present

## 2023-10-19 DIAGNOSIS — K5730 Diverticulosis of large intestine without perforation or abscess without bleeding: Secondary | ICD-10-CM | POA: Insufficient documentation

## 2023-10-19 DIAGNOSIS — K621 Rectal polyp: Secondary | ICD-10-CM

## 2023-10-19 HISTORY — PX: COLONOSCOPY WITH PROPOFOL: SHX5780

## 2023-10-19 HISTORY — PX: POLYPECTOMY: SHX5525

## 2023-10-19 SURGERY — COLONOSCOPY WITH PROPOFOL
Anesthesia: General

## 2023-10-19 MED ORDER — PROPOFOL 10 MG/ML IV BOLUS
INTRAVENOUS | Status: DC | PRN
  Administered 2023-10-19: 150 ug/kg/min via INTRAVENOUS
  Administered 2023-10-19: 80 mg via INTRAVENOUS

## 2023-10-19 MED ORDER — LIDOCAINE HCL (CARDIAC) PF 100 MG/5ML IV SOSY
PREFILLED_SYRINGE | INTRAVENOUS | Status: DC | PRN
Start: 2023-10-19 — End: 2023-10-19
  Administered 2023-10-19: 50 mg via INTRAVENOUS

## 2023-10-19 MED ORDER — SODIUM CHLORIDE 0.9 % IV SOLN: INTRAVENOUS | Status: DC

## 2023-10-19 NOTE — Anesthesia Preprocedure Evaluation (Signed)
Anesthesia Evaluation  Patient identified by MRN, date of birth, ID band Patient awake    Reviewed: Allergy & Precautions, NPO status , Patient's Chart, lab work & pertinent test results  Airway Mallampati: III  TM Distance: >3 FB Neck ROM: full    Dental  (+) Partial Upper   Pulmonary neg pulmonary ROS, asthma , former smoker   Pulmonary exam normal  + decreased breath sounds      Cardiovascular Exercise Tolerance: Poor hypertension, Pt. on medications + DOE  negative cardio ROS Normal cardiovascular exam Rhythm:Regular Rate:Normal     Neuro/Psych    Depression    negative neurological ROS  negative psych ROS   GI/Hepatic negative GI ROS, Neg liver ROS,,,  Endo/Other  negative endocrine ROS  Class 4 obesity  Renal/GU negative Renal ROS  negative genitourinary   Musculoskeletal   Abdominal  (+) + obese  Peds negative pediatric ROS (+)  Hematology negative hematology ROS (+)   Anesthesia Other Findings Past Medical History: No date: Arthritis No date: Asthma No date: Depression No date: Diverticulitis No date: History of chicken pox No date: Hyperlipidemia No date: Hypertension  Past Surgical History: No date: ABDOMINAL HYSTERECTOMY No date: COLECTOMY No date: COLONOSCOPY WITH PROPOFOL No date: electrode spine No date: LUMBAR SPINE SURGERY     Comment:  x6 No date: ROTATOR CUFF REPAIR  BMI    Body Mass Index: 45.52 kg/m      Reproductive/Obstetrics negative OB ROS                             Anesthesia Physical Anesthesia Plan  ASA: 3  Anesthesia Plan: General   Post-op Pain Management:    Induction: Intravenous  PONV Risk Score and Plan: Propofol infusion and TIVA  Airway Management Planned: Natural Airway and Nasal Cannula  Additional Equipment:   Intra-op Plan:   Post-operative Plan:   Informed Consent: I have reviewed the patients History and  Physical, chart, labs and discussed the procedure including the risks, benefits and alternatives for the proposed anesthesia with the patient or authorized representative who has indicated his/her understanding and acceptance.     Dental Advisory Given  Plan Discussed with: CRNA and Surgeon  Anesthesia Plan Comments:        Anesthesia Quick Evaluation

## 2023-10-19 NOTE — Anesthesia Procedure Notes (Signed)
Date/Time: 10/19/2023 10:18 AM  Performed by: Ginger Carne, CRNAPre-anesthesia Checklist: Patient identified, Emergency Drugs available, Suction available, Patient being monitored and Timeout performed Patient Re-evaluated:Patient Re-evaluated prior to induction Oxygen Delivery Method: Nasal cannula Preoxygenation: Pre-oxygenation with 100% oxygen Induction Type: IV induction Ventilation: Mask ventilation without difficulty

## 2023-10-19 NOTE — H&P (Signed)
Arlyss Repress, MD [address]  [address]  [place], [place] [address]  Main: [phone]  Fax: [phone] Pager: [phone]  Primary Care Physician:  Dana Allan, MD Primary Gastroenterologist:  Dr. Arlyss Repress  Pre-Procedure History & Physical: HPI:  Judy Porter is a 72 y.o. female is here for an colonoscopy.   Past Medical History:  Diagnosis Date   Arthritis    Asthma    Depression    Diverticulitis    History of chicken pox    Hyperlipidemia    Hypertension     Past Surgical History:  Procedure Laterality Date   ABDOMINAL HYSTERECTOMY     COLECTOMY     COLONOSCOPY WITH PROPOFOL     electrode spine     LUMBAR SPINE SURGERY     x6   ROTATOR CUFF REPAIR      Prior to Admission medications   Medication Sig Start Date End Date Taking? Authorizing Provider  albuterol (VENTOLIN HFA) 108 (90 Base) MCG/ACT inhaler Inhale 1 puff into the lungs every 6 (six) hours as needed. 09/29/23   Dana Allan, MD  Albuterol-Budesonide [facility]) 90-80 MCG/ACT AERO Inhale 1-2 puffs into the lungs every 4 (four) hours as needed. 09/29/23   Dana Allan, MD  Albuterol-Budesonide [facility]) 90-80 MCG/ACT AERO Inhale 1-2 puffs into the lungs every 4 (four) hours as needed. 09/29/23   Dana Allan, MD  amLODipine (NORVASC) 2.5 MG tablet Take 1 tablet (2.5 mg total) by mouth at bedtime. 09/29/23   Dana Allan, MD  aspirin EC 81 MG tablet Take 81 mg by mouth daily. 12/23/15   [provider]  atorvastatin (LIPITOR) 20 MG tablet Take 1 tablet (20 mg total) by mouth daily. 09/29/23   Dana Allan, MD  carvedilol (COREG) 6.25 MG tablet Take 1 tablet (6.25 mg total) by mouth 2 (two) times daily with a meal. 09/29/23 09/28/24  Dana Allan, MD  DULoxetine (CYMBALTA) 20 MG capsule Take 2 capsules (40 mg total) by mouth daily. 10/06/23   Dana Allan, MD  gabapentin (NEURONTIN) 400 MG capsule Take 2 capsules (800 mg total) by mouth 2 (two) times daily. 09/29/23 09/28/24   Dana Allan, MD  hydrochlorothiazide (HYDRODIURIL) 25 MG tablet Take 1 tablet (25 mg total) by mouth daily. 09/29/23   Dana Allan, MD  losartan (COZAAR) 100 MG tablet Take 1 tablet (100 mg total) by mouth daily. 09/29/23   Dana Allan, MD  meloxicam (MOBIC) 7.5 MG tablet Take 1 tablet (7.5 mg total) by mouth daily. 10/16/23   Dana Allan, MD  metFORMIN (GLUCOPHAGE-XR) 500 MG 24 hr tablet Take 1 tablet (500 mg total) by mouth daily with breakfast. 09/29/23   Dana Allan, MD  omeprazole (PRILOSEC) 20 MG capsule Take 1 capsule (20 mg total) by mouth daily. 10/16/23   Dana Allan, MD  traZODone (DESYREL) 50 MG tablet Take 1 tablet (50 mg total) by mouth at bedtime as needed for sleep. 09/29/23   Dana Allan, MD    Allergies as of 09/14/2023 - Review Complete 04/29/2020  Allergen Reaction Noted   Elemental sulfur Hives 04/29/2020   Sulfa antibiotics Hives 12/23/2015    Family History  Problem Relation Age of Onset   Hypertension Mother    Early death Mother    Depression Mother    Arthritis Mother    Heart disease Father    Hypertension Father    Arthritis Father     Social History   Socioeconomic History   Marital status: Widowed  Spouse name: Not on file   Number of children: Not on file   Years of education: Not on file   Highest education level: Not on file  Occupational History   Not on file  Tobacco Use   Smoking status: Former    Current packs/day: 0.00    Types: Cigarettes    Quit date: 09/08/2003    Years since quitting: 20.1   Smokeless tobacco: Never  Vaping Use   Vaping status: Never Used  Substance and Sexual Activity   Alcohol use: Never   Drug use: Never   Sexual activity: Not Currently  Other Topics Concern   Not on file  Social History Narrative   Not on file   Social Determinants of Health   Financial Resource Strain: Not on file  Food Insecurity: Not on file  Transportation Needs: Not on file  Physical Activity: Not on file  Stress:  Not on file  Social Connections: Not on file  Intimate Partner Violence: Not on file    Review of Systems: See HPI, otherwise negative ROS  Physical Exam: BP 133/74   Pulse 70   Temp (!) 96.4 F (35.8 C) (Temporal)   Resp 18   Ht 5\' 6"  (1.676 m)   Wt 282 lb (127.9 kg)   LMP  (LMP Unknown)   SpO2 99%   BMI 45.52 kg/m  General:   Alert,  pleasant and cooperative in NAD Head:  Normocephalic and atraumatic. Neck:  Supple; no masses or thyromegaly. Lungs:  Clear throughout to auscultation.    Heart:  Regular rate and rhythm. Abdomen:  Soft, nontender and nondistended. Normal bowel sounds, without guarding, and without rebound.   Neurologic:  Alert and  oriented x4;  grossly normal neurologically.  Impression/Plan: Judy Porter is here for a colonoscopy to be performed for colon cancer screening  Risks, benefits, limitations, and alternatives regarding  colonoscopy have been reviewed with the patient.  Questions have been answered.  All parties agreeable.   Lannette Donath, MD  10/19/2023, 10:57 AM

## 2023-10-19 NOTE — Anesthesia Postprocedure Evaluation (Signed)
Anesthesia Post Note  Patient: Judy Porter  Procedure(s) Performed: COLONOSCOPY WITH PROPOFOL POLYPECTOMY  Patient location during evaluation: PACU Anesthesia Type: General Level of consciousness: awake and awake and alert Pain management: satisfactory to patient Vital Signs Assessment: post-procedure vital signs reviewed and stable Respiratory status: spontaneous breathing and nonlabored ventilation Cardiovascular status: stable Anesthetic complications: no   No notable events documented.   Last Vitals:  Vitals:   10/19/23 1046 10/19/23 1056  BP: (!) 110/43 133/74  Pulse: 68 70  Resp: 20 18  Temp: (!) 35.8 C   SpO2: 99% 99%    Last Pain:  Vitals:   10/19/23 1046  TempSrc: Temporal                 VAN STAVEREN,[name]

## 2023-10-19 NOTE — Op Note (Signed)
[facility] Gastroenterology Patient Name: Judy Porter Procedure Date: 10/19/2023 10:06 AM MRN: [id number] Account #: [id number] Date of Birth: 1951/05/28 Admit Type: Outpatient Age: 72 Room: [facility] ENDO ROOM 3 Gender: Female Note Status: Finalized Instrument Name: Colonoscope [id number] Procedure:             Colonoscopy Indications:           Screening for colorectal malignant neoplasm Providers:             Toney Reil MD, MD Referring MD:          Dana Allan (Referring MD) Medicines:             General Anesthesia Complications:         No immediate complications. Estimated blood loss: None. Procedure:             Pre-Anesthesia Assessment:                        - Prior to the procedure, a History and Physical was                         performed, and patient medications and allergies were                         reviewed. The patient is competent. The risks and                         benefits of the procedure and the sedation options and                         risks were discussed with the patient. All questions                         were answered and informed consent was obtained.                         Patient identification and proposed procedure were                         verified by the physician, the nurse, the                         anesthesiologist, the anesthetist and the technician                         in the pre-procedure area in the procedure room in the                         endoscopy suite. Mental Status Examination: alert and                         oriented. Airway Examination: normal oropharyngeal                         airway and neck mobility. Respiratory Examination:                         clear to auscultation. CV Examination: normal.  Prophylactic Antibiotics: The patient does not require                         prophylactic antibiotics. Prior Anticoagulants: The                          patient has taken no anticoagulant or antiplatelet                         agents. ASA Grade Assessment: III - A patient with                         severe systemic disease. After reviewing the risks and                         benefits, the patient was deemed in satisfactory                         condition to undergo the procedure. The anesthesia                         plan was to use general anesthesia. Immediately prior                         to administration of medications, the patient was                         re-assessed for adequacy to receive sedatives. The                         heart rate, respiratory rate, oxygen saturations,                         blood pressure, adequacy of pulmonary ventilation, and                         response to care were monitored throughout the                         procedure. The physical status of the patient was                         re-assessed after the procedure.                        After obtaining informed consent, the colonoscope was                         passed under direct vision. Throughout the procedure,                         the patient's blood pressure, pulse, and oxygen                         saturations were monitored continuously. The                         Colonoscope was introduced through the anus and  advanced to the the cecum, identified by appendiceal                         orifice and ileocecal valve. The colonoscopy was                         performed without difficulty. The patient tolerated                         the procedure well. The quality of the bowel                         preparation was adequate to identify polyps greater                         than 5 mm in size. The ileocecal valve, appendiceal                         orifice, and rectum were photographed. Findings:      The perianal and digital rectal examinations were normal. Pertinent       negatives include  normal sphincter tone and no palpable rectal lesions.      A diminutive polyp was found in the cecum. The polyp was sessile. The       polyp was removed with a jumbo cold forceps. Resection and retrieval       were complete. Estimated blood loss: none.      Five sessile polyps were found in the transverse colon. The polyps were       3 to 7 mm in size. These polyps were removed with a cold snare.       Resection and retrieval were complete. Estimated blood loss: none.      A 4 mm polyp was found in the descending colon. The polyp was sessile.       The polyp was removed with a cold snare. Resection and retrieval were       complete. Estimated blood loss: none.      A 5 mm polyp was found in the sigmoid colon. The polyp was sessile. The       polyp was removed with a cold snare. Resection and retrieval were       complete. Estimated blood loss: none.      Multiple small-mouthed diverticula were found in the sigmoid colon.      Non-bleeding external hemorrhoids were found during retroflexion. The       hemorrhoids were small. Impression:            - One diminutive polyp in the cecum, removed with a                         jumbo cold forceps. Resected and retrieved.                        - Five 3 to 7 mm polyps in the transverse colon,                         removed with a cold snare. Resected and retrieved.                        - One  4 mm polyp in the descending colon, removed with                         a cold snare. Resected and retrieved.                        - One 5 mm polyp in the sigmoid colon, removed with a                         cold snare. Resected and retrieved.                        - Diverticulosis in the sigmoid colon.                        - Non-bleeding external hemorrhoids. Recommendation:        - Discharge patient to home (with escort).                        - Resume previous diet today.                        - Continue present medications.                         - Repeat colonoscopy in 3 years for surveillance of                         multiple polyps. Procedure Code(s):     --- Professional ---                        [phone], Colonoscopy, flexible; with removal of                         tumor(s), polyp(s), or other lesion(s) by snare                         technique                        45380, 59, Colonoscopy, flexible; with biopsy, single                         or multiple Diagnosis Code(s):     --- Professional ---                        Z12.11, Encounter for screening for malignant neoplasm                         of colon                        D12.0, Benign neoplasm of cecum                        D12.4, Benign neoplasm of descending colon                        D12.5, Benign neoplasm of sigmoid colon  D12.3, Benign neoplasm of transverse colon (hepatic                         flexure or splenic flexure)                        K64.4, Residual hemorrhoidal skin tags                        K57.30, Diverticulosis of large intestine without                         perforation or abscess without bleeding CPT copyright 2022 American Medical Association. All rights reserved. The codes documented in this report are preliminary and upon coder review may  be revised to meet current compliance requirements. Dr. Libby Maw Toney Reil MD, MD 10/19/2023 10:45:43 AM This report has been signed electronically. Number of Addenda: 0 Note Initiated On: 10/19/2023 10:06 AM Scope Withdrawal Time: 0 hours 17 minutes 7 seconds  Total Procedure Duration: 0 hours 19 minutes 20 seconds  Estimated Blood Loss:  Estimated blood loss: none.      [facility]

## 2023-10-19 NOTE — Transfer of Care (Signed)
Immediate Anesthesia Transfer of Care Note  Patient: Judy Porter  Procedure(s) Performed: COLONOSCOPY WITH PROPOFOL POLYPECTOMY  Patient Location: Endoscopy Unit  Anesthesia Type:General  Level of Consciousness: awake and alert   Airway & Oxygen Therapy: Patient Spontanous Breathing  Post-op Assessment: Report given to RN and Post -op Vital signs reviewed and stable  Post vital signs: Reviewed and stable  Last Vitals:  Vitals Value Taken Time  BP 110/43 10/19/23 1046  Temp    Pulse 68 10/19/23 1046  Resp 20 10/19/23 1046  SpO2 99 % 10/19/23 1046    Last Pain:  Vitals:   10/19/23 0951  TempSrc: Temporal         Complications: No notable events documented.

## 2023-10-20 ENCOUNTER — Encounter: Payer: Self-pay | Admitting: Gastroenterology

## 2023-10-20 LAB — SURGICAL PATHOLOGY

## 2023-10-24 ENCOUNTER — Other Ambulatory Visit: Payer: Self-pay | Admitting: Family Medicine

## 2023-10-24 ENCOUNTER — Telehealth: Payer: Self-pay

## 2023-10-24 NOTE — Telephone Encounter (Signed)
Called pt and she wanted provider to know that the metformin is giving her diarrhea and wants to know if there is something else she can take instead.

## 2023-10-26 NOTE — Telephone Encounter (Signed)
Pt wanted to know if she could get sent in a script for wegovy or Ozempic since she is prediabetic or another injection to help with the weight loss.

## 2023-10-26 NOTE — Telephone Encounter (Signed)
Called pt and she is not interested in going to [facility] or [facility].

## 2023-11-10 NOTE — Telephone Encounter (Signed)
Patient states she is returning a call from [employer], CMA.  I transferred call to [facility].

## 2023-11-10 NOTE — Telephone Encounter (Signed)
Patient called in and wanted to know since she no longer takes the Metformin due to stomach issues and being pre-diabetic, if she could get on [place], [place], or etc.

## 2023-11-10 NOTE — Telephone Encounter (Signed)
Left message to return call to our office.  

## 2023-11-10 NOTE — Telephone Encounter (Signed)
Pt stated that she would start back on the metformin.

## 2023-11-30 ENCOUNTER — Other Ambulatory Visit: Payer: Medicare (Managed Care)

## 2023-12-11 ENCOUNTER — Other Ambulatory Visit (HOSPITAL_COMMUNITY): Payer: Self-pay

## 2023-12-12 ENCOUNTER — Other Ambulatory Visit (HOSPITAL_COMMUNITY): Payer: Self-pay

## 2023-12-12 MED ORDER — OZEMPIC (0.25 OR 0.5 MG/DOSE) 2 MG/3ML ~~LOC~~ SOPN
0.2500 mg | PEN_INJECTOR | SUBCUTANEOUS | 6 refills | Status: DC
  Filled 2023-12-18: qty 3, 56d supply, fill #0

## 2023-12-12 MED ORDER — METFORMIN HCL ER 500 MG PO TB24
500.0000 mg | ORAL_TABLET | Freq: Every day | ORAL | 3 refills | Status: DC
  Filled 2023-12-15: qty 90, 90d supply, fill #0
  Filled 2024-04-05: qty 90, 90d supply, fill #1

## 2023-12-14 ENCOUNTER — Other Ambulatory Visit (HOSPITAL_COMMUNITY): Payer: Self-pay

## 2023-12-15 ENCOUNTER — Ambulatory Visit
Admission: RE | Admit: 2023-12-15 | Discharge: 2023-12-15 | Disposition: A | Discharge status: Home / Self Care | Payer: PPO | Source: Ambulatory Visit | Attending: Family Medicine

## 2023-12-15 ENCOUNTER — Other Ambulatory Visit (HOSPITAL_COMMUNITY): Payer: Self-pay

## 2023-12-15 ENCOUNTER — Other Ambulatory Visit: Payer: Self-pay

## 2023-12-15 ENCOUNTER — Ambulatory Visit
Admission: RE | Admit: 2023-12-15 | Discharge: 2023-12-15 | Disposition: A | Discharge status: Home / Self Care | Payer: PPO | Source: Ambulatory Visit | Attending: Family Medicine | Admitting: Family Medicine

## 2023-12-15 DIAGNOSIS — R29890 Loss of height: Secondary | ICD-10-CM | POA: Diagnosis not present

## 2023-12-15 DIAGNOSIS — G629 Polyneuropathy, unspecified: Secondary | ICD-10-CM | POA: Diagnosis not present

## 2023-12-15 DIAGNOSIS — R7303 Prediabetes: Secondary | ICD-10-CM | POA: Diagnosis not present

## 2023-12-15 DIAGNOSIS — Z1231 Encounter for screening mammogram for malignant neoplasm of breast: Secondary | ICD-10-CM | POA: Diagnosis not present

## 2023-12-15 DIAGNOSIS — Z780 Asymptomatic menopausal state: Secondary | ICD-10-CM | POA: Diagnosis not present

## 2023-12-18 ENCOUNTER — Other Ambulatory Visit: Payer: Self-pay

## 2023-12-18 ENCOUNTER — Telehealth: Payer: Self-pay

## 2023-12-18 ENCOUNTER — Other Ambulatory Visit (HOSPITAL_COMMUNITY): Payer: Self-pay

## 2023-12-18 NOTE — Telephone Encounter (Signed)
 Noted.

## 2023-12-18 NOTE — Telephone Encounter (Signed)
 Copied from CRM [id number]. Topic: General - Other >> Dec 18, 2023  1:25 PM Alcus Dad H wrote: Reason for CRM: Patient wants to let Tresa Endo know she was able to get her ozempic and it is being mailed out to her

## 2023-12-19 ENCOUNTER — Other Ambulatory Visit: Payer: Self-pay | Admitting: *Deleted

## 2023-12-19 ENCOUNTER — Other Ambulatory Visit: Payer: Self-pay

## 2023-12-19 ENCOUNTER — Inpatient Hospital Stay
Admission: RE | Admit: 2023-12-19 | Discharge: 2023-12-19 | Disposition: A | Discharge status: Home / Self Care | Payer: Self-pay | Source: Ambulatory Visit | Attending: Family Medicine

## 2023-12-19 ENCOUNTER — Other Ambulatory Visit (HOSPITAL_COMMUNITY): Payer: Self-pay

## 2023-12-19 DIAGNOSIS — Z1231 Encounter for screening mammogram for malignant neoplasm of breast: Secondary | ICD-10-CM

## 2024-01-10 ENCOUNTER — Ambulatory Visit (INDEPENDENT_AMBULATORY_CARE_PROVIDER_SITE_OTHER): Payer: Self-pay | Admitting: Family Medicine

## 2024-01-10 ENCOUNTER — Other Ambulatory Visit: Payer: Self-pay

## 2024-01-10 ENCOUNTER — Encounter: Payer: Self-pay | Admitting: Family Medicine

## 2024-01-10 ENCOUNTER — Telehealth: Payer: Self-pay

## 2024-01-10 ENCOUNTER — Other Ambulatory Visit (HOSPITAL_COMMUNITY): Payer: Self-pay

## 2024-01-10 VITALS — BP 134/66 | HR 70 | Temp 98.0°F | Resp 17 | Ht 66.0 in | Wt 287.0 lb

## 2024-01-10 DIAGNOSIS — E1159 Type 2 diabetes mellitus with other circulatory complications: Secondary | ICD-10-CM

## 2024-01-10 DIAGNOSIS — E785 Hyperlipidemia, unspecified: Secondary | ICD-10-CM

## 2024-01-10 DIAGNOSIS — E1169 Type 2 diabetes mellitus with other specified complication: Secondary | ICD-10-CM

## 2024-01-10 DIAGNOSIS — J45909 Unspecified asthma, uncomplicated: Secondary | ICD-10-CM

## 2024-01-10 DIAGNOSIS — Z7985 Long-term (current) use of injectable non-insulin antidiabetic drugs: Secondary | ICD-10-CM

## 2024-01-10 DIAGNOSIS — F39 Unspecified mood [affective] disorder: Secondary | ICD-10-CM

## 2024-01-10 DIAGNOSIS — E118 Type 2 diabetes mellitus with unspecified complications: Secondary | ICD-10-CM

## 2024-01-10 DIAGNOSIS — I152 Hypertension secondary to endocrine disorders: Secondary | ICD-10-CM

## 2024-01-10 DIAGNOSIS — M1991 Primary osteoarthritis, unspecified site: Secondary | ICD-10-CM

## 2024-01-10 DIAGNOSIS — G629 Polyneuropathy, unspecified: Secondary | ICD-10-CM

## 2024-01-10 DIAGNOSIS — M1711 Unilateral primary osteoarthritis, right knee: Secondary | ICD-10-CM

## 2024-01-10 MED ORDER — AMLODIPINE BESYLATE 2.5 MG PO TABS
2.5000 mg | ORAL_TABLET | Freq: Every evening | ORAL | 3 refills | Status: DC
  Filled 2024-01-10: qty 90, 90d supply, fill #0
  Filled 2024-04-05: qty 90, 90d supply, fill #1

## 2024-01-10 MED ORDER — MELOXICAM 7.5 MG PO TABS
7.5000 mg | ORAL_TABLET | Freq: Every day | ORAL | 3 refills | Status: DC
  Filled 2024-01-10: qty 90, 90d supply, fill #0
  Filled 2024-04-15: qty 90, 90d supply, fill #1

## 2024-01-10 MED ORDER — OMEPRAZOLE 20 MG PO CPDR
20.0000 mg | DELAYED_RELEASE_CAPSULE | Freq: Every day | ORAL | 3 refills | Status: DC
  Filled 2024-01-10: qty 90, 90d supply, fill #0
  Filled 2024-04-15: qty 90, 90d supply, fill #1

## 2024-01-10 MED ORDER — OZEMPIC (0.25 OR 0.5 MG/DOSE) 2 MG/3ML ~~LOC~~ SOPN
0.5000 mg | PEN_INJECTOR | SUBCUTANEOUS | 6 refills | Status: DC
  Filled 2024-01-10: qty 3, fill #0
  Filled 2024-02-19 – 2024-02-23 (×2): qty 3, 28d supply, fill #0
  Filled 2024-04-05: qty 3, 28d supply, fill #1

## 2024-01-10 NOTE — Progress Notes (Signed)
 SUBJECTIVE:   Chief Complaint  Patient presents with   Medical Management of Chronic Issues    3 month follow up   HPI Presents for follow up chronic disease management   Discussed the use of AI scribe software for clinical note transcription with the patient, who gave verbal consent to proceed.  History of Present Illness Judy Porter is a 73 year old female with hypertension and diabetes who presents for medication management and follow-up.  She is here for follow-up regarding her hypertension management. Her blood pressure has been well-controlled with the current regimen, which includes amlodipine  2.5 mg at night and carvedilol  6.25 mg twice daily. She is also taking hydrochlorothiazide  and losartan  for blood pressure management. Her blood pressure readings have been satisfactory, and she is not experiencing any adverse effects from her medications.  Regarding her diabetes, she is currently on Ozempic  0.25 mg and metformin . She has been taking metformin  without any gastrointestinal side effects. She recently started Ozempic  and has completed three doses. She reports no issues with the medication and is hopeful for continued use. She is also on atorvastatin  for cholesterol management.  She experiences joint pain for which she takes meloxicam  7.5 mg. Meloxicam  helps with her joint pain, and she takes it alongside omeprazole  to protect her stomach. She also uses Tylenol for pain management, taking two 500 mg tablets in the morning and two at night, with an additional dose at lunchtime if needed. She finds this regimen effective in managing her pain.  She is on gabapentin  800 mg twice daily for neuropathy, which has helped alleviate symptoms, particularly in her toes. She also takes duloxetine  40 mg, which has improved her mood and reduced irritability.  She mentions having asthma and uses Airsupra  as needed, particularly during colder months when her symptoms worsen. She has been  using it effectively without needing daily administration.  She has a dog, a Hungarian Vizsla, which she enjoys spending time with. She is focused on maintaining a healthy lifestyle and mentions efforts to reduce stress and improve her overall well-being.      PERTINENT PMH / PSH: As above  OBJECTIVE:  BP 134/66   Pulse 70   Temp 98 F (36.7 C)   Resp 17   Ht 5' 6 (1.676 m)   Wt 287 lb (130.2 kg)   LMP  (LMP Unknown)   SpO2 95%   BMI 46.32 kg/m    Physical Exam Vitals reviewed.  Constitutional:      General: She is not in acute distress.    Appearance: Normal appearance. She is obese. She is not ill-appearing, toxic-appearing or diaphoretic.  Eyes:     General:        Right eye: No discharge.        Left eye: No discharge.     Conjunctiva/sclera: Conjunctivae normal.  Cardiovascular:     Rate and Rhythm: Normal rate and regular rhythm.     Heart sounds: Normal heart sounds.  Pulmonary:     Effort: Pulmonary effort is normal.     Breath sounds: Normal breath sounds.  Abdominal:     General: Bowel sounds are normal.  Musculoskeletal:        General: Normal range of motion.  Skin:    General: Skin is warm and dry.  Neurological:     General: No focal deficit present.     Mental Status: She is alert and oriented to person, place, and time. Mental status is at baseline.  Psychiatric:        Mood and Affect: Mood normal.        Behavior: Behavior normal.        Thought Content: Thought content normal.        Judgment: Judgment normal.           01/10/2024    1:48 PM 09/29/2023    1:54 PM 09/08/2023    2:20 PM  Depression screen PHQ 2/9  Decreased Interest 1 1 1   Down, Depressed, Hopeless 1 0 0  PHQ - 2 Score 2 1 1   Altered sleeping 1 1 0  Tired, decreased energy 1 2 3   Change in appetite 0 0 0  Feeling bad or failure about yourself  0 0 0  Trouble concentrating 2 0 0  Moving slowly or fidgety/restless 0 0 0  Suicidal thoughts 0 0 0  PHQ-9 Score 6 4 4    Difficult doing work/chores Somewhat difficult Not difficult at all Somewhat difficult      01/10/2024    1:48 PM 09/29/2023    1:55 PM 09/08/2023    2:20 PM  GAD 7 : Generalized Anxiety Score  Nervous, Anxious, on Edge 1 0 0  Control/stop worrying 1 0 0  Worry too much - different things 1 0 2  Trouble relaxing 0 1 0  Restless 0 0 0  Easily annoyed or irritable 0 1 2  Afraid - awful might happen 0 0 0  Total GAD 7 Score 3 2 4   Anxiety Difficulty Not difficult at all Not difficult at all Somewhat difficult    ASSESSMENT/PLAN:  Type 2 diabetes with complication [facility]) Assessment & Plan: Previous history of A1c 6.5 08/27/2020.  Patient is on Ozempic  0.25mg  and metformin . Discussed increasing Ozempic  to 0.5mg  to further control blood glucose levels and aid in weight loss. Patient tolerating medications well. -Increase Ozempic  to 0.5mg  after finishing current supply. -Continue metformin . -Check A1C in 3 months.  Orders: -     Ozempic  (0.25 or 0.5 MG/DOSE); Inject 0.5 mg into the skin once a week.  Dispense: 3 mL; Refill: 6 -     Microalbumin / creatinine urine ratio  Morbid obesity (HCC) Assessment & Plan: -Continue GLP1 weekly injection   Osteoarthritis of right knee, unspecified osteoarthritis type -     Omeprazole ; Take 1 capsule (20 mg total) by mouth daily.  Dispense: 90 capsule; Refill: 3 -     Meloxicam ; Take 1 tablet (7.5 mg total) by mouth daily.  Dispense: 90 tablet; Refill: 3  Hypertension associated with diabetes (HCC) Assessment & Plan: Blood pressure well controlled on current regimen of amlodipine  2.5mg  and carvedilol  6.25mg  BID. -Continue amlodipine  2.5mg  and carvedilol  6.25mg  BID. -Refill amlodipine  for the year.  Orders: -     Ozempic  (0.25 or 0.5 MG/DOSE); Inject 0.5 mg into the skin once a week.  Dispense: 3 mL; Refill: 6 -     amLODIPine  Besylate; Take 1 tablet (2.5 mg total) by mouth at bedtime.  Dispense: 90 tablet; Refill: 3  Hyperlipidemia,  unspecified hyperlipidemia type Assessment & Plan: On Atorvastatin  20mg . -Continue current medication.   Orders: -     Ozempic  (0.25 or 0.5 MG/DOSE); Inject 0.5 mg into the skin once a week.  Dispense: 3 mL; Refill: 6  Hyperlipidemia associated with type 2 diabetes mellitus (HCC) Assessment & Plan: On Atorvastatin  20mg . -Continue current medication.    Mood disorder [facility]) Assessment & Plan: Patient is on duloxetine  40mg  and finds it helpful. -Continue duloxetine   40mg .   Neuropathy Assessment & Plan: Patient is on gabapentin  800mg  BID and finds it helpful. -Continue gabapentin  800mg  BID.   Primary osteoarthritis, unspecified site Assessment & Plan: Patient is on meloxicam  7.5mg  and finds it helpful. Discussed potential stomach issues with long-term use and the possibility of using Tylenol for pain management. -Continue meloxicam  7.5mg . -Continue omeprazole  20mg  while on meloxicam . Consider weaning off if patient stops meloxicam .  -Consider using Tylenol (up to 2500mg  daily) for pain management and potentially weaning off meloxicam .   Uncomplicated asthma, unspecified asthma severity, unspecified whether persistent Assessment & Plan: Patient found Airsupra  helpful but had issues with insurance coverage. -Restart Albuterol  inhaler     PDMP reviewed  Return in about 6 months (around 07/09/2024) for PCP, DM, HTN.  Glenys Ferrari, MD

## 2024-01-10 NOTE — Telephone Encounter (Addendum)
 Pt reiceiving a letter stating that it need a PA for Ozempic 

## 2024-01-10 NOTE — Patient Instructions (Addendum)
 It was a pleasure meeting you today. Thank you for allowing me to take part in your health care.  Our goals for today as we discussed include:  Refills sent for requested medication  Increase Ozempic  to 0.5 mg weekly  Take Tylenol 500 mg two tablets in the morning and night and 1 tablet at lunch   Airsupra  not covered so unfortunately cannot order now.  Will need to use Albuterol  and if can get in future will reorder.    This is a list of the screening recommended for you and due dates:  Health Maintenance  Topic Date Due   Complete foot exam   Never done   Eye exam for diabetics  Never done   Yearly kidney health urinalysis for diabetes  Never done   DTaP/Tdap/Td vaccine (1 - Tdap) Never done   Zoster (Shingles) Vaccine (1 of 2) Never done   COVID-19 Vaccine (3 - Moderna risk series) 03/16/2020   Medicare Annual Wellness Visit  01/10/2024   Hemoglobin A1C  03/08/2024   Yearly kidney function blood test for diabetes  09/07/2024   Mammogram  12/14/2025   Colon Cancer Screening  10/18/2026   Pneumonia Vaccine  Completed   Flu Shot  Completed   DEXA scan (bone density measurement)  Completed   Hepatitis C Screening  Completed   HPV Vaccine  Aged Out     If you have any questions or concerns, please do not hesitate to call the office at [phone].  I look forward to our next visit and until then take care and stay safe.  Regards,   Glenys Ferrari, MD   [facility]

## 2024-01-11 ENCOUNTER — Encounter: Payer: Self-pay | Admitting: Family Medicine

## 2024-01-11 ENCOUNTER — Other Ambulatory Visit (HOSPITAL_COMMUNITY): Payer: Self-pay

## 2024-01-11 DIAGNOSIS — M1990 Unspecified osteoarthritis, unspecified site: Secondary | ICD-10-CM | POA: Insufficient documentation

## 2024-01-11 LAB — MICROALBUMIN / CREATININE URINE RATIO
Creatinine,U: 79.2 mg/dL
Microalb Creat Ratio: 2.7 mg/g (ref 0.0–30.0)
Microalb, Ur: 2.1 mg/dL — ABNORMAL HIGH (ref 0.0–1.9)

## 2024-01-11 NOTE — Assessment & Plan Note (Signed)
 Patient is on duloxetine  40mg  and finds it helpful. -Continue duloxetine  40mg .

## 2024-01-11 NOTE — Assessment & Plan Note (Signed)
-  Continue GLP1 weekly injection

## 2024-01-11 NOTE — Assessment & Plan Note (Signed)
 Patient found Airsupra  helpful but had issues with insurance coverage. -Restart Albuterol  inhaler

## 2024-01-11 NOTE — Assessment & Plan Note (Signed)
 Blood pressure well controlled on current regimen of amlodipine  2.5mg  and carvedilol  6.25mg  BID. -Continue amlodipine  2.5mg  and carvedilol  6.25mg  BID. -Refill amlodipine  for the year.

## 2024-01-11 NOTE — Assessment & Plan Note (Signed)
 Patient is on gabapentin  800mg  BID and finds it helpful. -Continue gabapentin  800mg  BID.

## 2024-01-11 NOTE — Assessment & Plan Note (Signed)
 Previous history of A1c 6.5 08/27/2020.  Patient is on Ozempic  0.25mg  and metformin . Discussed increasing Ozempic  to 0.5mg  to further control blood glucose levels and aid in weight loss. Patient tolerating medications well. -Increase Ozempic  to 0.5mg  after finishing current supply. -Continue metformin . -Check A1C in 3 months.

## 2024-01-11 NOTE — Assessment & Plan Note (Addendum)
 Patient is on meloxicam  7.5mg  and finds it helpful. Discussed potential stomach issues with long-term use and the possibility of using Tylenol for pain management. -Continue meloxicam  7.5mg . -Continue omeprazole  20mg  while on meloxicam . Consider weaning off if patient stops meloxicam .  -Consider using Tylenol (up to 2500mg  daily) for pain management and potentially weaning off meloxicam .

## 2024-01-11 NOTE — Assessment & Plan Note (Signed)
 On Atorvastatin  20mg . -Continue current medication.

## 2024-01-16 ENCOUNTER — Telehealth: Payer: Self-pay

## 2024-01-16 ENCOUNTER — Other Ambulatory Visit (HOSPITAL_COMMUNITY): Payer: Self-pay

## 2024-01-16 NOTE — Telephone Encounter (Signed)
PA request has been Submitted. New Encounter created for follow up. For additional info see Pharmacy Prior Auth telephone encounter from 01/16/2024.

## 2024-01-16 NOTE — Telephone Encounter (Signed)
noted

## 2024-01-16 NOTE — Telephone Encounter (Signed)
Pharmacy Patient Advocate Encounter   Received notification from CoverMyMeds that prior authorization for Ozempic (0.25 or 0.5 MG/DOSE) 2MG /3ML pen-injectors is required/requested.   Insurance verification completed.   The patient is insured through [facility] ADVANTAGE/RX ADVANCE .   Per test claim: PA required; PA submitted to above mentioned insurance via CoverMyMeds Key/confirmation #/EOC [id number] Status is pending

## 2024-01-16 NOTE — Telephone Encounter (Signed)
Pharmacy Patient Advocate Encounter  Received notification from [facility] ADVANTAGE/RX ADVANCE that Prior Authorization for Ozempic (0.25 or 0.5 MG/DOSE) 2MG /3ML pen-injectors has been APPROVED from 01/16/2024 to 01/15/2025   PA #/Case ID/Reference #: [id number]

## 2024-02-09 ENCOUNTER — Telehealth: Payer: Self-pay

## 2024-02-09 NOTE — Telephone Encounter (Signed)
 Copied from CRM [id number]. Topic: Referral - Question >> Feb 09, 2024 11:34 AM Alcus Dad wrote: Reason for CRM: Patient is wanting a referral for her Hip injections. Please give patient a call back at your earliest convenience.

## 2024-02-13 NOTE — Telephone Encounter (Signed)
 Copied from CRM [id number]. Topic: General - Other >> Feb 13, 2024  1:39 PM [place] A wrote: Reason for CRM: Patient was returning "Tresa Endo" phone call-please call

## 2024-02-13 NOTE — Telephone Encounter (Signed)
 Called pt and she does not want to go to [facility], and wanted to know if there is another place she can go? She stated that she wanted to stay in [place].

## 2024-02-13 NOTE — Telephone Encounter (Signed)
 Left message to return call to our office.

## 2024-02-14 ENCOUNTER — Ambulatory Visit (INDEPENDENT_AMBULATORY_CARE_PROVIDER_SITE_OTHER): Payer: Self-pay | Admitting: *Deleted

## 2024-02-14 VITALS — Ht 65.0 in | Wt 280.0 lb

## 2024-02-14 DIAGNOSIS — Z0000 Encounter for general adult medical examination without abnormal findings: Secondary | ICD-10-CM

## 2024-02-14 NOTE — Progress Notes (Signed)
 Subjective:   Judy Porter is a 73 y.o. who presents for a Medicare Wellness preventive visit.  Visit Complete: Virtual I connected with  Michiel Cowboy on 02/14/24 by a audio enabled telemedicine application and verified that I am speaking with the correct person using two identifiers.  Patient Location: Home  Provider Location: Office/Clinic  I discussed the limitations of evaluation and management by telemedicine. The patient expressed understanding and agreed to proceed.  Vital Signs: Because this visit was a virtual/telehealth visit, some criteria may be missing or patient reported. Any vitals not documented were not able to be obtained and vitals that have been documented are patient reported.  VideoDeclined- This patient declined Librarian, academic. Therefore the visit was completed with audio only.  AWV Questionnaire: No: Patient Medicare AWV questionnaire was not completed prior to this visit.  Cardiac Risk Factors include: advanced age (>24men, >75 women);obesity (BMI >30kg/m2);diabetes mellitus;dyslipidemia;hypertension     Objective:    Today's Vitals   02/14/24 1455  Weight: 280 lb (127 kg)  Height: 5\' 5"  (1.651 m)  PainSc: 8    Body mass index is 46.59 kg/m.     02/14/2024    3:11 PM 10/19/2023    9:50 AM  Advanced Directives  Does Patient Have a Medical Advance Directive? No Yes  Would patient like information on creating a medical advance directive? No - Patient declined     Current Medications (verified) Outpatient Encounter Medications as of 02/14/2024  Medication Sig   albuterol (VENTOLIN HFA) 108 (90 Base) MCG/ACT inhaler Inhale 1 puff into the lungs every 6 (six) hours as needed.   amLODipine (NORVASC) 2.5 MG tablet Take 1 tablet (2.5 mg total) by mouth at bedtime.   atorvastatin (LIPITOR) 20 MG tablet Take 1 tablet (20 mg total) by mouth daily.   carvedilol (COREG) 6.25 MG tablet Take 1 tablet (6.25 mg total) by  mouth 2 (two) times daily with a meal.   DULoxetine (CYMBALTA) 20 MG capsule Take 2 capsules (40 mg total) by mouth daily.   gabapentin (NEURONTIN) 400 MG capsule Take 2 capsules (800 mg total) by mouth 2 (two) times daily.   hydrochlorothiazide (HYDRODIURIL) 25 MG tablet Take 1 tablet (25 mg total) by mouth daily.   losartan (COZAAR) 100 MG tablet Take 1 tablet (100 mg total) by mouth daily.   meloxicam (MOBIC) 7.5 MG tablet Take 1 tablet (7.5 mg total) by mouth daily.   metFORMIN (GLUCOPHAGE-XR) 500 MG 24 hr tablet Take 1 tablet (500 mg total) by mouth daily with breakfast.   omeprazole (PRILOSEC) 20 MG capsule Take 1 capsule (20 mg total) by mouth daily.   Semaglutide,0.25 or 0.5MG /DOS, (OZEMPIC, 0.25 OR 0.5 MG/DOSE,) 2 MG/3ML SOPN Inject 0.5 mg into the skin once a week.   traZODone (DESYREL) 50 MG tablet Take 1 tablet (50 mg total) by mouth at bedtime as needed for sleep.   aspirin EC 81 MG tablet Take 81 mg by mouth daily. (Patient not taking: Reported on 02/14/2024)   No facility-administered encounter medications on file as of 02/14/2024.    Allergies (verified) Elemental sulfur and Sulfa antibiotics   History: Past Medical History:  Diagnosis Date   Arthritis    Asthma    Depression    Diverticulitis    History of chicken pox    Hyperlipidemia    Hypertension    Prediabetes 01/09/2023   Past Surgical History:  Procedure Laterality Date   ABDOMINAL HYSTERECTOMY     COLECTOMY  COLONOSCOPY WITH PROPOFOL     COLONOSCOPY WITH PROPOFOL N/A 10/19/2023   Procedure: COLONOSCOPY WITH PROPOFOL;  Surgeon: Toney Reil, MD;  Location: [facility] ENDOSCOPY;  Service: Gastroenterology;  Laterality: N/A;   electrode spine     LUMBAR SPINE SURGERY     x6   POLYPECTOMY  10/19/2023   Procedure: POLYPECTOMY;  Surgeon: Toney Reil, MD;  Location: [facility] ENDOSCOPY;  Service: Gastroenterology;;   REDUCTION MAMMAPLASTY Bilateral    1994 per pt   ROTATOR CUFF REPAIR     Family  History  Problem Relation Age of Onset   Hypertension Mother    Early death Mother    Depression Mother    Arthritis Mother    Heart disease Father    Hypertension Father    Arthritis Father    Breast cancer Neg Hx    Social History   Socioeconomic History   Marital status: Widowed    Spouse name: Not on file   Number of children: Not on file   Years of education: Not on file   Highest education level: 12th grade  Occupational History   Not on file  Tobacco Use   Smoking status: Former    Current packs/day: 0.00    Types: Cigarettes    Quit date: 09/08/2003    Years since quitting: 20.4   Smokeless tobacco: Never  Vaping Use   Vaping status: Never Used  Substance and Sexual Activity   Alcohol use: Never   Drug use: Never   Sexual activity: Not Currently  Other Topics Concern   Not on file  Social History Narrative   Not on file   Social Drivers of Health   Financial Resource Strain: Low Risk  (02/14/2024)   Overall Financial Resource Strain (CARDIA)    Difficulty of Paying Living Expenses: Not hard at all  Recent Concern: Financial Resource Strain - High Risk (01/07/2024)   Overall Financial Resource Strain (CARDIA)    Difficulty of Paying Living Expenses: Very hard  Food Insecurity: No Food Insecurity (02/14/2024)   Hunger Vital Sign    Worried About Running Out of Food in the Last Year: Never true    Ran Out of Food in the Last Year: Never true  Recent Concern: Food Insecurity - Food Insecurity Present (01/07/2024)   Hunger Vital Sign    Worried About Running Out of Food in the Last Year: Often true    Ran Out of Food in the Last Year: Often true  Transportation Needs: No Transportation Needs (02/14/2024)   PRAPARE - Administrator, Civil Service (Medical): No    Lack of Transportation (Non-Medical): No  Recent Concern: Transportation Needs - Unmet Transportation Needs (01/07/2024)   PRAPARE - Transportation    Lack of Transportation (Medical): Yes     Lack of Transportation (Non-Medical): Yes  Physical Activity: Inactive (02/14/2024)   Exercise Vital Sign    Days of Exercise per Week: 0 days    Minutes of Exercise per Session: 0 min  Stress: No Stress Concern Present (02/14/2024)   [employer] of Occupational Health - Occupational Stress Questionnaire    Feeling of Stress : Not at all  Social Connections: Moderately Isolated (02/14/2024)   Social Connection and Isolation Panel [NHANES]    Frequency of Communication with Friends and Family: Twice a week    Frequency of Social Gatherings with Friends and Family: More than three times a week    Attends Religious Services: More than 4 times per year  Active Member of Clubs or Organizations: No    Attends Banker Meetings: Never    Marital Status: Widowed    Tobacco Counseling Counseling given: Not Answered    Clinical Intake:  Pre-visit preparation completed: Yes  Pain : 0-10 Pain Score: 8  Pain Type: Chronic pain Pain Location: Hip Pain Orientation: Right Pain Descriptors / Indicators: Aching, Dull, Nagging Pain Onset: More than a month ago Pain Frequency: Constant     BMI - recorded: 46.59 Nutritional Status: BMI > 30  Obese Nutritional Risks: None Diabetes: Yes CBG done?: No Did pt. bring in CBG monitor from home?: No  How often do you need to have someone help you when you read instructions, pamphlets, or other written materials from your doctor or pharmacy?: 1 - Never  Interpreter Needed?: No  Information entered by :: R. Brees Hounshell LPN   Activities of Daily Living     02/14/2024    2:58 PM  In your present state of health, do you have any difficulty performing the following activities:  Hearing? 0  Vision? 0  Comment glasses  Difficulty concentrating or making decisions? 1  Walking or climbing stairs? 1  Dressing or bathing? 0  Doing errands, shopping? 0  Preparing Food and eating ? N  Using the Toilet? N  In the past six months, have  you accidently leaked urine? Y  Do you have problems with loss of bowel control? N  Managing your Medications? N  Managing your Finances? N  Housekeeping or managing your Housekeeping? N    Patient Care Team: Dana Allan, MD as PCP - General (Family Medicine)  Indicate any recent Medical Services you may have received from other than [facility] providers in the past year (date may be approximate).     Assessment:   This is a routine wellness examination for Judy Porter.  Hearing/Vision screen Hearing Screening - Comments:: No issues Vision Screening - Comments:: glasses   Goals Addressed             This Visit's Progress    Patient Stated       Wants to lose weight 75-80 pounds       Depression Screen     02/14/2024    3:04 PM 01/10/2024    1:48 PM 09/29/2023    1:54 PM 09/08/2023    2:20 PM  PHQ 2/9 Scores  PHQ - 2 Score 0 2 1 1   PHQ- 9 Score 2 6 4 4     Fall Risk     02/14/2024    3:00 PM 01/10/2024    1:21 PM 09/29/2023    1:53 PM 09/08/2023    2:20 PM  Fall Risk   Falls in the past year? 1 0 0 1  Number falls in past yr: 1 0 0 0  Injury with Fall? 0 0 0   Risk for fall due to : History of fall(s);Impaired balance/gait No Fall Risks No Fall Risks Impaired balance/gait  Follow up Falls prevention discussed;Falls evaluation completed Falls evaluation completed;Education provided Falls evaluation completed Falls evaluation completed    MEDICARE RISK AT HOME:  Medicare Risk at Home Any stairs in or around the home?: No If so, are there any without handrails?: No Home free of loose throw rugs in walkways, pet beds, electrical cords, etc?: Yes Adequate lighting in your home to reduce risk of falls?: Yes Life alert?: No Use of a cane, walker or w/c?: Yes Grab bars in the bathroom?: No Shower chair or  bench in shower?: No Elevated toilet seat or a handicapped toilet?: No  TIMED UP AND GO:  Was the test performed?  No  Cognitive Function: 6CIT completed         02/14/2024    3:11 PM  6CIT Screen  What Year? 0 points  What month? 0 points  What time? 0 points  Count back from 20 0 points  Months in reverse 4 points  Repeat phrase 2 points  Total Score 6 points    Immunizations Immunization History  Administered Date(s) Administered   Fluad Trivalent(High Dose 65+) 09/08/2023   Influenza,inj,Quad PF,6+ Mos 08/27/2020   Moderna Sars-Covid-2 Vaccination 01/17/2020, 02/17/2020   PNEUMOCOCCAL CONJUGATE-20 09/08/2023   Pneumococcal Conjugate-13 02/03/2020    Screening Tests Health Maintenance  Topic Date Due   FOOT EXAM  Never done   OPHTHALMOLOGY EXAM  Never done   DTaP/Tdap/Td (1 - Tdap) Never done   Zoster Vaccines- Shingrix (1 of 2) Never done   COVID-19 Vaccine (3 - Moderna risk series) 03/16/2020   HEMOGLOBIN A1C  03/08/2024   Diabetic kidney evaluation - eGFR measurement  09/07/2024   Diabetic kidney evaluation - Urine ACR  01/09/2025   Medicare Annual Wellness (AWV)  02/13/2025   MAMMOGRAM  12/14/2025   Colonoscopy  10/18/2026   Pneumonia Vaccine 49+ Years old  Completed   INFLUENZA VACCINE  Completed   DEXA SCAN  Completed   Hepatitis C Screening  Completed   HPV VACCINES  Aged Out    Health Maintenance  Health Maintenance Due  Topic Date Due   FOOT EXAM  Never done   OPHTHALMOLOGY EXAM  Never done   DTaP/Tdap/Td (1 - Tdap) Never done   Zoster Vaccines- Shingrix (1 of 2) Never done   COVID-19 Vaccine (3 - Moderna risk series) 03/16/2020   Health Maintenance Items Addressed: Discussed the need to update tetanus and shingles vaccines.   Patient needs a diabetic foot exam at next visit and documented.  Additional Screening:  Vision Screening: Recommended annual ophthalmology exams for early detection of glaucoma and other disorders of the eye. Overdue. Patient is to contact her insurance company and see who in this area is in network. Patient stated that she will call and schedule a diabetic eye exam.  Dental  Screening: Recommended annual dental exams for proper oral hygiene  Community Resource Referral / Chronic Care Management: CRR required this visit?  No   CCM required this visit?  No     Plan:     I have personally reviewed and noted the following in the patient's chart:   Medical and social history Use of alcohol, tobacco or illicit drugs  Current medications and supplements including opioid prescriptions. Patient is not currently taking opioid prescriptions. Functional ability and status Nutritional status Physical activity Advanced directives List of other physicians Hospitalizations, surgeries, and ER visits in previous 12 months Vitals Screenings to include cognitive, depression, and falls Referrals and appointments  In addition, I have reviewed and discussed with patient certain preventive protocols, quality metrics, and best practice recommendations. A written personalized care plan for preventive services as well as general preventive health recommendations were provided to patient.     Sydell Axon, LPN   03/13/8118   After Visit Summary: (MyChart) Due to this being a telephonic visit, the after visit summary with patients personalized plan was offered to patient via MyChart   Notes: Please refer to Routing Comments.

## 2024-02-14 NOTE — Patient Instructions (Signed)
 Ms. Froman , Thank you for taking time to come for your Medicare Wellness Visit. I appreciate your ongoing commitment to your health goals. Please review the following plan we discussed and let me know if I can assist you in the future.   Referrals/Orders/Follow-Ups/Clinician Recommendations: Remember to call and schedule a diabetic eye exam and update your tetanus and shingles vaccines.  This is a list of the screening recommended for you and due dates:  Health Maintenance  Topic Date Due   Complete foot exam   Never done   Eye exam for diabetics  Never done   DTaP/Tdap/Td vaccine (1 - Tdap) Never done   Zoster (Shingles) Vaccine (1 of 2) Never done   COVID-19 Vaccine (3 - Moderna risk series) 03/16/2020   Hemoglobin A1C  03/08/2024   Yearly kidney function blood test for diabetes  09/07/2024   Yearly kidney health urinalysis for diabetes  01/09/2025   Medicare Annual Wellness Visit  02/13/2025   Mammogram  12/14/2025   Colon Cancer Screening  10/18/2026   Pneumonia Vaccine  Completed   Flu Shot  Completed   DEXA scan (bone density measurement)  Completed   Hepatitis C Screening  Completed   HPV Vaccine  Aged Out    Advanced directives: (Declined) Advance directive discussed with you today. Even though you declined this today, please call our office should you change your mind, and we can give you the proper paperwork for you to fill out.  Next Medicare Annual Wellness Visit scheduled for next year: Yes 02/18/25 @ 3:00

## 2024-02-19 ENCOUNTER — Other Ambulatory Visit: Payer: Self-pay

## 2024-02-19 ENCOUNTER — Other Ambulatory Visit (HOSPITAL_COMMUNITY): Payer: Self-pay

## 2024-02-20 ENCOUNTER — Other Ambulatory Visit: Payer: Self-pay

## 2024-02-20 ENCOUNTER — Encounter: Payer: Self-pay | Admitting: Pharmacist

## 2024-02-20 DIAGNOSIS — M1711 Unilateral primary osteoarthritis, right knee: Secondary | ICD-10-CM

## 2024-02-20 NOTE — Telephone Encounter (Signed)
 Referral has been placed, and they will give her a call to scheduled that. Left message to return call to our office.

## 2024-02-23 ENCOUNTER — Other Ambulatory Visit (HOSPITAL_COMMUNITY): Payer: Self-pay

## 2024-02-23 ENCOUNTER — Other Ambulatory Visit: Payer: Self-pay

## 2024-02-26 ENCOUNTER — Other Ambulatory Visit: Payer: Self-pay

## 2024-02-26 ENCOUNTER — Other Ambulatory Visit (HOSPITAL_COMMUNITY): Payer: Self-pay

## 2024-03-05 DIAGNOSIS — M1611 Unilateral primary osteoarthritis, right hip: Secondary | ICD-10-CM | POA: Diagnosis not present

## 2024-03-05 DIAGNOSIS — M47816 Spondylosis without myelopathy or radiculopathy, lumbar region: Secondary | ICD-10-CM | POA: Diagnosis not present

## 2024-03-05 DIAGNOSIS — M25551 Pain in right hip: Secondary | ICD-10-CM | POA: Diagnosis not present

## 2024-03-05 DIAGNOSIS — G8929 Other chronic pain: Secondary | ICD-10-CM | POA: Diagnosis not present

## 2024-03-08 DIAGNOSIS — M1611 Unilateral primary osteoarthritis, right hip: Secondary | ICD-10-CM | POA: Diagnosis not present

## 2024-03-10 DIAGNOSIS — M1611 Unilateral primary osteoarthritis, right hip: Secondary | ICD-10-CM | POA: Diagnosis not present

## 2024-03-27 ENCOUNTER — Other Ambulatory Visit (HOSPITAL_COMMUNITY): Payer: Self-pay

## 2024-04-05 ENCOUNTER — Ambulatory Visit: Payer: Self-pay | Admitting: Family Medicine

## 2024-04-05 ENCOUNTER — Other Ambulatory Visit (HOSPITAL_COMMUNITY): Payer: Self-pay

## 2024-04-05 ENCOUNTER — Other Ambulatory Visit: Payer: Self-pay

## 2024-04-15 ENCOUNTER — Other Ambulatory Visit (HOSPITAL_COMMUNITY): Payer: Self-pay

## 2024-05-03 ENCOUNTER — Telehealth: Payer: Self-pay

## 2024-05-03 NOTE — Telephone Encounter (Signed)
 Copied from CRM [id number]. Topic: Appointments - Scheduling Inquiry for Clinic >> May 03, 2024  2:13 PM Baldo Levan wrote: Reason for CRM: Patient is calling in to schedule a follow up appointment with Dr. Sueanne Emerald. Decision tree is showing no available appointments at all. Please assist patient with scheduling .  I spoke with patient and let her know that Dr. Sueanne Emerald does not have any openings on her schedule between now and her last day seeing patients (05/22/2024).  Patient states she is diabetic and needs to reschedule her appointment that she missed earlier this month.  I scheduled an appointment for patient to see Dr. Sharia Daunt on 05/15/2024.  I offered to schedule a TOC appointment for patient, but patient states she would like to keep [employer], CMA, as her CMA, so she would like to transfer her care to whichever provider Loetta Ringer will be assisting.

## 2024-05-10 ENCOUNTER — Encounter: Payer: Self-pay | Admitting: Pharmacist

## 2024-05-10 ENCOUNTER — Other Ambulatory Visit: Payer: Self-pay

## 2024-05-10 ENCOUNTER — Encounter: Payer: Self-pay | Admitting: Family Medicine

## 2024-05-10 ENCOUNTER — Ambulatory Visit: Payer: Self-pay | Admitting: Family Medicine

## 2024-05-10 ENCOUNTER — Other Ambulatory Visit (HOSPITAL_COMMUNITY): Payer: Self-pay

## 2024-05-10 VITALS — BP 134/72 | HR 58 | Temp 98.2°F | Resp 20 | Ht 65.0 in | Wt 271.2 lb

## 2024-05-10 DIAGNOSIS — I152 Hypertension secondary to endocrine disorders: Secondary | ICD-10-CM | POA: Diagnosis not present

## 2024-05-10 DIAGNOSIS — M1611 Unilateral primary osteoarthritis, right hip: Secondary | ICD-10-CM

## 2024-05-10 DIAGNOSIS — E1159 Type 2 diabetes mellitus with other circulatory complications: Secondary | ICD-10-CM

## 2024-05-10 DIAGNOSIS — J45909 Unspecified asthma, uncomplicated: Secondary | ICD-10-CM

## 2024-05-10 DIAGNOSIS — M1711 Unilateral primary osteoarthritis, right knee: Secondary | ICD-10-CM

## 2024-05-10 DIAGNOSIS — E1169 Type 2 diabetes mellitus with other specified complication: Secondary | ICD-10-CM

## 2024-05-10 DIAGNOSIS — E538 Deficiency of other specified B group vitamins: Secondary | ICD-10-CM | POA: Diagnosis not present

## 2024-05-10 DIAGNOSIS — E118 Type 2 diabetes mellitus with unspecified complications: Secondary | ICD-10-CM

## 2024-05-10 DIAGNOSIS — E559 Vitamin D deficiency, unspecified: Secondary | ICD-10-CM | POA: Diagnosis not present

## 2024-05-10 DIAGNOSIS — F39 Unspecified mood [affective] disorder: Secondary | ICD-10-CM | POA: Diagnosis not present

## 2024-05-10 DIAGNOSIS — E785 Hyperlipidemia, unspecified: Secondary | ICD-10-CM

## 2024-05-10 DIAGNOSIS — G629 Polyneuropathy, unspecified: Secondary | ICD-10-CM

## 2024-05-10 LAB — POCT GLYCOSYLATED HEMOGLOBIN (HGB A1C): Hemoglobin A1C: 5.7 % — AB (ref 4.0–5.6)

## 2024-05-10 MED ORDER — LOSARTAN POTASSIUM 100 MG PO TABS
100.0000 mg | ORAL_TABLET | Freq: Every day | ORAL | 3 refills | Status: AC
  Filled 2024-05-10 – 2024-07-03 (×2): qty 90, 90d supply, fill #0
  Filled 2024-09-23: qty 90, 90d supply, fill #1
  Filled 2024-12-24: qty 90, 90d supply, fill #2

## 2024-05-10 MED ORDER — AMLODIPINE BESYLATE 2.5 MG PO TABS
2.5000 mg | ORAL_TABLET | Freq: Every evening | ORAL | 3 refills | Status: AC
  Filled 2024-05-10 – 2024-07-03 (×2): qty 90, 90d supply, fill #0
  Filled 2024-09-23: qty 90, 90d supply, fill #1
  Filled 2024-12-24: qty 90, 90d supply, fill #2

## 2024-05-10 MED ORDER — SEMAGLUTIDE (1 MG/DOSE) 4 MG/3ML ~~LOC~~ SOPN
1.0000 mg | PEN_INJECTOR | SUBCUTANEOUS | 3 refills | Status: AC
  Filled 2024-05-10: qty 9, 84d supply, fill #0
  Filled 2024-05-14: qty 3, 28d supply, fill #0
  Filled 2024-07-03: qty 3, 28d supply, fill #1
  Filled 2024-08-02: qty 3, 28d supply, fill #2
  Filled 2024-09-17 – 2024-12-19 (×2): qty 3, 28d supply, fill #3

## 2024-05-10 MED ORDER — HYDROCHLOROTHIAZIDE 25 MG PO TABS
25.0000 mg | ORAL_TABLET | Freq: Every day | ORAL | 3 refills | Status: AC
  Filled 2024-05-10 – 2024-07-03 (×2): qty 90, 90d supply, fill #0
  Filled 2024-09-23: qty 90, 90d supply, fill #1
  Filled 2024-12-24: qty 90, 90d supply, fill #2

## 2024-05-10 MED ORDER — CARVEDILOL 6.25 MG PO TABS
6.2500 mg | ORAL_TABLET | Freq: Two times a day (BID) | ORAL | 11 refills | Status: DC
  Filled 2024-05-10: qty 60, 30d supply, fill #0
  Filled 2024-07-03: qty 60, 30d supply, fill #1
  Filled 2024-08-02: qty 60, 30d supply, fill #2
  Filled 2024-12-19: qty 60, 30d supply, fill #3

## 2024-05-10 MED ORDER — ALBUTEROL SULFATE HFA 108 (90 BASE) MCG/ACT IN AERS
1.0000 | INHALATION_SPRAY | Freq: Four times a day (QID) | RESPIRATORY_TRACT | 3 refills | Status: AC | PRN
  Filled 2024-05-10: qty 6.7, 50d supply, fill #0
  Filled 2025-01-01: qty 6.7, 50d supply, fill #1

## 2024-05-10 MED ORDER — ATORVASTATIN CALCIUM 20 MG PO TABS
20.0000 mg | ORAL_TABLET | Freq: Every day | ORAL | 3 refills | Status: AC
  Filled 2024-05-10 – 2024-07-03 (×2): qty 90, 90d supply, fill #0
  Filled 2024-09-23: qty 90, 90d supply, fill #1
  Filled 2024-12-24: qty 90, 90d supply, fill #2

## 2024-05-10 MED ORDER — METFORMIN HCL ER 500 MG PO TB24
500.0000 mg | ORAL_TABLET | Freq: Every day | ORAL | 3 refills | Status: DC
  Filled 2024-05-10 – 2024-07-03 (×2): qty 90, 90d supply, fill #0
  Filled 2024-09-23: qty 90, 90d supply, fill #1

## 2024-05-10 MED ORDER — TRAZODONE HCL 50 MG PO TABS
50.0000 mg | ORAL_TABLET | Freq: Every evening | ORAL | 3 refills | Status: AC | PRN
  Filled 2024-05-10 – 2024-07-03 (×2): qty 90, 90d supply, fill #0
  Filled 2024-09-23: qty 90, 90d supply, fill #1
  Filled 2024-12-18: qty 90, 90d supply, fill #2

## 2024-05-10 MED ORDER — NABUMETONE 500 MG PO TABS
500.0000 mg | ORAL_TABLET | Freq: Every day | ORAL | 0 refills | Status: AC
  Filled 2024-05-10: qty 30, 30d supply, fill #0

## 2024-05-10 MED ORDER — OMEPRAZOLE 20 MG PO CPDR
20.0000 mg | DELAYED_RELEASE_CAPSULE | Freq: Every day | ORAL | 3 refills | Status: AC
  Filled 2024-05-10 – 2024-08-02 (×2): qty 90, 90d supply, fill #0
  Filled 2024-12-19: qty 90, 90d supply, fill #1

## 2024-05-10 MED ORDER — DULOXETINE HCL 20 MG PO CPEP
40.0000 mg | ORAL_CAPSULE | Freq: Every day | ORAL | 3 refills | Status: AC
  Filled 2024-05-10 – 2024-07-03 (×2): qty 180, 90d supply, fill #0
  Filled 2024-09-23: qty 180, 90d supply, fill #1
  Filled 2024-12-24: qty 180, 90d supply, fill #2

## 2024-05-10 NOTE — Progress Notes (Signed)
 SUBJECTIVE:   Chief Complaint  Patient presents with   Diabetes   HPI Presents to clinic for chronic disease management  Discussed the use of AI scribe software for clinical note transcription with the patient, who gave verbal consent to proceed.  History of Present Illness Judy Porter is a 73 year old female with diabetes who presents for a follow-up visit.  She is following up on her diabetes management and is pleased with her recent A1c of 5.7%. She has been using Ozempic , initially at 0.2 mg, but recently increased to 0.5 mg weekly, which she feels is effective. She has experienced significant weight loss, going from 307 lbs to 271 lbs, and notes improved mobility, stating she no longer needs her cane and can walk without difficulty.  She has made dietary changes, avoiding fried foods and reducing salt intake. She enjoys fruits like peaches and grapes, although she is aware of their sugar content. 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  She has a history of hip pain for which she received injections, providing 75% relief initially, but she still experiences pain. She has been using meloxicam  and Tylenol for pain management, taking Tylenol 650 mg twice daily. Her goal is to reach a weight of 248 lbs before her hip surgery, which is planned for August. She is engaging in light resistance training to maintain muscle mass during her weight loss journey.  She is currently on several medications including metformin , losartan , hydrochlorothiazide , and Cymbalta  (duloxetine  40 mg), and reports doing well on them. She also takes gabapentin  twice daily but is unsure of its effectiveness after long-term use.  No dizziness, chest pain, shortness of breath, or swelling in her legs. No issues with her current medications and no significant side effects.     PERTINENT PMH / PSH: As above  OBJECTIVE:   BP 134/72   Pulse (!) 58   Temp 98.2 F (36.8 C)   Resp 20   Ht 5\' 5"  (1.651 m)   Wt 271 lb 4 oz (123 kg)   LMP  (LMP Unknown)   SpO2 98%   BMI 45.14 kg/m    Physical Exam Vitals reviewed.  Constitutional:      General: She is not in acute distress.    Appearance: Normal appearance. She is obese. She is not ill-appearing, toxic-appearing or diaphoretic.  Eyes:     General:        Right eye: No discharge.        Left eye: No discharge.     Conjunctiva/sclera: Conjunctivae normal.  Cardiovascular:     Rate and Rhythm: Regular rhythm. Bradycardia present.     Heart sounds: Normal heart sounds.  Pulmonary:     Effort: Pulmonary effort is normal.     Breath sounds: Normal breath sounds.  Abdominal:     General: Bowel sounds are normal.  Musculoskeletal:        General: Normal range of motion.  Skin:    General: Skin is warm and dry.  Neurological:     General: No focal deficit present.     Mental Status: She is alert and oriented to person, place, and time. Mental status is at baseline.  Psychiatric:        Mood and Affect: Mood normal.        Behavior: Behavior normal.        Thought Content: Thought content normal.  Judgment: Judgment normal.           05/10/2024    1:27 PM 02/14/2024    3:04 PM 01/10/2024    1:48 PM 09/29/2023    1:54 PM 09/08/2023    2:20 PM  Depression screen PHQ 2/9  Decreased Interest 1 0 1 1 1   Down, Depressed, Hopeless 1 0 1 0 0  PHQ - 2 Score 2 0 2 1 1   Altered sleeping 1 0 1 1 0  Tired, decreased energy 0 1 1 2 3   Change in appetite 0 0 0 0 0  Feeling bad or failure about yourself  0 0 0 0 0  Trouble concentrating 0 1 2 0 0  Moving slowly or fidgety/restless 0 0 0 0 0  Suicidal thoughts 0 0 0 0 0  PHQ-9 Score 3 2 6 4 4   Difficult doing work/chores Not difficult at all Somewhat difficult Somewhat difficult Not difficult at all Somewhat difficult      05/10/2024    1:27 PM 01/10/2024    1:48 PM 09/29/2023    1:55 PM 09/08/2023     2:20 PM  GAD 7 : Generalized Anxiety Score  Nervous, Anxious, on Edge 0 1 0 0  Control/stop worrying 0 1 0 0  Worry too much - different things 0 1 0 2  Trouble relaxing 1 0 1 0  Restless 0 0 0 0  Easily annoyed or irritable 1 0 1 2  Afraid - awful might happen 0 0 0 0  Total GAD 7 Score 2 3 2 4   Anxiety Difficulty Not difficult at all Not difficult at all Not difficult at all Somewhat difficult    ASSESSMENT/PLAN:  Type 2 diabetes with complication [facility]) Assessment & Plan: Diabetes well-controlled with A1c of 5.7%. Significant weight loss achieved. Motivated for lifestyle modifications. - Increase Ozempic  to 1 mg weekly. - Continue monitoring blood glucose levels. - Encourage continued dietary modifications and exercise. - Recommend annual eye exam - Annual foot exam completed - On statin   Orders: -     HM Diabetes Foot Exam -     POCT glycosylated hemoglobin (Hb A1C) -     metFORMIN  HCl ER; Take 1 tablet (500 mg total) by mouth daily with breakfast.  Dispense: 90 tablet; Refill: 3 -     Semaglutide  (1 MG/DOSE); Inject 1 mg as directed once a week.  Dispense: 9 mL; Refill: 3  Mood disorder (HCC) Assessment & Plan: Doing well on current dose - Refill Duloxetine  40 mg daily - Refill Trazodone  50 mg qhs  Orders: -     DULoxetine  HCl; Take 2 capsules (40 mg total) by mouth daily.  Dispense: 180 capsule; Refill: 3 -     traZODone  HCl; Take 1 tablet (50 mg total) by mouth at bedtime as needed for sleep.  Dispense: 90 tablet; Refill: 3  Hypertension associated with diabetes (HCC) Assessment & Plan: Blood pressure well controlled on current regimen  - Refill Amlodipine  2.5mg  - Refill Carvedilol  6.25mg  BID. - Refill losartan  100 mg daily - Refill Hydrochlorothiazide  25 mg daily - Monitor BP at home. Goal <140/90  Orders: -     Losartan  Potassium; Take 1 tablet (100 mg total) by mouth daily.  Dispense: 90 tablet; Refill: 3 -     Carvedilol ; Take 1 tablet (6.25 mg  total) by mouth 2 (two) times daily with a meal.  Dispense: 60 tablet; Refill: 11 -     hydroCHLOROthiazide ; Take 1 tablet (25  mg total) by mouth daily.  Dispense: 90 tablet; Refill: 3 -     amLODIPine  Besylate; Take 1 tablet (2.5 mg total) by mouth at bedtime.  Dispense: 90 tablet; Refill: 3 -     Comprehensive metabolic panel with GFR  Osteoarthritis of right knee, unspecified osteoarthritis type -     Nabumetone ; Take 1 tablet (500 mg total) by mouth daily.  Dispense: 30 tablet; Refill: 0 -     Omeprazole ; Take 1 capsule (20 mg total) by mouth daily.  Dispense: 90 capsule; Refill: 3  Hyperlipidemia associated with type 2 diabetes mellitus (HCC) Assessment & Plan: Tolerating statin -Refill Atorvastatin  40 mg daily   Orders: -     Atorvastatin  Calcium ; Take 1 tablet (20 mg total) by mouth daily.  Dispense: 90 tablet; Refill: 3 -     Lipid panel  Vitamin B 12 deficiency Assessment & Plan: Check Vitamin B 12 level  Orders: -     Vitamin B12  Vitamin D  deficiency Assessment & Plan: Check Vitamin D  level  Orders: -     VITAMIN D  25 Hydroxy (Vit-D Deficiency, Fractures)  Uncomplicated asthma, unspecified asthma severity, unspecified whether persistent -     Albuterol  Sulfate HFA; Inhale 1 puff into the lungs every 6 (six) hours as needed.  Dispense: 6.7 g; Refill: 3  Morbid obesity (HCC) Assessment & Plan: Significant weight loss achieved, improving mobility and health. Motivated to continue efforts. - Continue current weight loss efforts. - Increasing GLP1 weekly dose - Encourage resistance training to prevent muscle wasting.   Primary osteoarthritis of right hip Assessment & Plan: Chronic pain persists. Scheduled for surgery post-weight loss. Current management includes meloxicam  and Tylenol. Considering Relafen  due to NSAID concerns. - Consider trial of Relafen  500 mg once daily. - Continue Tylenol 650 mg as needed for pain. - Discuss use of Voltaren cream for  additional pain relief. - Discontinue Mobic     Neuropathy Assessment & Plan: Controlled with current medication - Refill Gabapentin  800 mg BID       PDMP reviewed  Return in about 6 months (around 11/09/2024) for PCP, DM.  Valli Gaw, MD

## 2024-05-10 NOTE — Patient Instructions (Addendum)
 It was a pleasure meeting you today. Thank you for allowing me to take part in your health care.  Our goals for today as we discussed include:  We will get some labs today.  If they are abnormal or we need to do something about them, I will call you.  If they are normal, I will send you a message on MyChart (if it is active) or a letter in the mail.  If you don't hear from us  in 2 weeks, please call the office at the number below.   Increase Ozempic  to 1 mg weekly  Refill sent for requested medications  Stop Meloxicam  Start Relafen 500 mg daily as needed for pain If not helping please notify MD     If you have any questions or concerns, please do not hesitate to call the office at [phone].  I look forward to our next visit and until then take care and stay safe.  Regards,   Valli Gaw, MD   [facility]

## 2024-05-11 LAB — COMPREHENSIVE METABOLIC PANEL WITH GFR
AG Ratio: 1.8 (calc) (ref 1.0–2.5)
ALT: 10 U/L (ref 6–29)
AST: 15 U/L (ref 10–35)
Albumin: 4.5 g/dL (ref 3.6–5.1)
Alkaline phosphatase (APISO): 44 U/L (ref 37–153)
BUN: 18 mg/dL (ref 7–25)
CO2: 27 mmol/L (ref 20–32)
Calcium: 10.1 mg/dL (ref 8.6–10.4)
Chloride: 105 mmol/L (ref 98–110)
Creat: 0.81 mg/dL (ref 0.60–1.00)
Globulin: 2.5 g/dL (ref 1.9–3.7)
Glucose, Bld: 87 mg/dL (ref 65–99)
Potassium: 4.4 mmol/L (ref 3.5–5.3)
Sodium: 143 mmol/L (ref 135–146)
Total Bilirubin: 0.5 mg/dL (ref 0.2–1.2)
Total Protein: 7 g/dL (ref 6.1–8.1)
eGFR: 78 mL/min/{1.73_m2} (ref >=60)

## 2024-05-11 LAB — LIPID PANEL
Cholesterol: 172 mg/dL (ref <200)
HDL: 76 mg/dL (ref >=50)
LDL Cholesterol (Calc): 79 mg/dL
Non-HDL Cholesterol (Calc): 96 mg/dL (ref <130)
Total CHOL/HDL Ratio: 2.3 (calc) (ref <5.0)
Triglycerides: 90 mg/dL (ref <150)

## 2024-05-11 LAB — VITAMIN D 25 HYDROXY (VIT D DEFICIENCY, FRACTURES): Vit D, 25-Hydroxy: 70 ng/mL (ref 30–100)

## 2024-05-11 LAB — VITAMIN B12: Vitamin B-12: 599 pg/mL (ref 200–1100)

## 2024-05-12 ENCOUNTER — Encounter: Payer: Self-pay | Admitting: Family Medicine

## 2024-05-12 NOTE — Assessment & Plan Note (Signed)
 Tolerating statin -Refill Atorvastatin  40 mg daily

## 2024-05-12 NOTE — Assessment & Plan Note (Signed)
 Significant weight loss achieved, improving mobility and health. Motivated to continue efforts. - Continue current weight loss efforts. - Increasing GLP1 weekly dose - Encourage resistance training to prevent muscle wasting.

## 2024-05-12 NOTE — Assessment & Plan Note (Signed)
 Check Vitamin B 12 level

## 2024-05-12 NOTE — Assessment & Plan Note (Signed)
 Chronic pain persists. Scheduled for surgery post-weight loss. Current management includes meloxicam  and Tylenol. Considering Relafen  due to NSAID concerns. - Consider trial of Relafen  500 mg once daily. - Continue Tylenol 650 mg as needed for pain. - Discuss use of Voltaren cream for additional pain relief. - Discontinue Mobic 

## 2024-05-12 NOTE — Assessment & Plan Note (Signed)
 Controlled with current medication - Refill Gabapentin  800 mg BID

## 2024-05-12 NOTE — Assessment & Plan Note (Addendum)
 Doing well on current dose - Refill Duloxetine  40 mg daily - Refill Trazodone  50 mg qhs

## 2024-05-12 NOTE — Assessment & Plan Note (Signed)
 Check Vitamin D level

## 2024-05-12 NOTE — Assessment & Plan Note (Signed)
 Blood pressure well controlled on current regimen  - Refill Amlodipine  2.5mg  - Refill Carvedilol  6.25mg  BID. - Refill losartan  100 mg daily - Refill Hydrochlorothiazide  25 mg daily - Monitor BP at home. Goal <140/90

## 2024-05-12 NOTE — Assessment & Plan Note (Signed)
 Diabetes well-controlled with A1c of 5.7%. Significant weight loss achieved. Motivated for lifestyle modifications. - Increase Ozempic  to 1 mg weekly. - Continue monitoring blood glucose levels. - Encourage continued dietary modifications and exercise. - Recommend annual eye exam - Annual foot exam completed - On statin

## 2024-05-14 ENCOUNTER — Other Ambulatory Visit: Payer: Self-pay

## 2024-05-14 ENCOUNTER — Other Ambulatory Visit (HOSPITAL_COMMUNITY): Payer: Self-pay

## 2024-05-15 ENCOUNTER — Ambulatory Visit: Payer: Self-pay | Admitting: Internal Medicine

## 2024-05-15 ENCOUNTER — Other Ambulatory Visit: Payer: Self-pay

## 2024-07-03 ENCOUNTER — Other Ambulatory Visit (HOSPITAL_COMMUNITY): Payer: Self-pay

## 2024-07-03 ENCOUNTER — Other Ambulatory Visit: Payer: Self-pay

## 2024-08-02 ENCOUNTER — Telehealth: Payer: Self-pay | Admitting: Family Medicine

## 2024-08-02 ENCOUNTER — Other Ambulatory Visit: Payer: Self-pay

## 2024-08-02 ENCOUNTER — Other Ambulatory Visit (HOSPITAL_COMMUNITY): Payer: Self-pay

## 2024-08-02 ENCOUNTER — Telehealth: Payer: Self-pay

## 2024-08-02 NOTE — Telephone Encounter (Signed)
 Message routed to admin pool to schedule pt a TOC appt as pt does not have any TOC appt scheduled pt will need an appt for refills

## 2024-08-02 NOTE — Telephone Encounter (Unsigned)
 Copied from CRM [id number]. Topic: Clinical - Medication Question >> Aug 02, 2024 12:44 PM Gennette ORN wrote: Reason for CRM: Patient is wanting to know can the dr refill her on the meloxicam  medication.

## 2024-08-02 NOTE — Telephone Encounter (Signed)
 Pt needs to have a TOC appt she is asking for refills however she needs to atleast have a TOC appt scheduled

## 2024-08-16 ENCOUNTER — Telehealth: Payer: Self-pay

## 2024-08-16 NOTE — Telephone Encounter (Signed)
 Noted

## 2024-08-16 NOTE — Telephone Encounter (Signed)
 Requesting medication not on profile, Meloxicam , 7.5mg 

## 2024-08-16 NOTE — Telephone Encounter (Unsigned)
 Copied from CRM #[id number]. Topic: Clinical - Medication Refill >> Aug 16, 2024  1:03 PM Turkey A wrote: Medication: Meloxicam  7.5 MG  Has the patient contacted their pharmacy? No (Agent: If no, request that the patient contact the pharmacy for the refill. If patient does not wish to contact the pharmacy document the reason why and proceed with request.) (Agent: If yes, when and what did the pharmacy advise?)  This is the patient's preferred pharmacy:   [facility]  [phone]   Is this the correct pharmacy for this prescription? Yes If no, delete pharmacy and type the correct one.   Has the prescription been filled recently? Yes  Is the patient out of the medication? Yes  Has the patient been seen for an appointment in the last year OR does the patient have an upcoming appointment? Yes  Can we respond through MyChart? Yes  Agent: Please be advised that Rx refills may take up to 3 business days. We ask that you follow-up with your pharmacy.

## 2024-09-12 ENCOUNTER — Telehealth: Payer: Self-pay

## 2024-09-12 ENCOUNTER — Telehealth: Payer: Self-pay | Admitting: Family Medicine

## 2024-09-12 DIAGNOSIS — F39 Unspecified mood [affective] disorder: Secondary | ICD-10-CM

## 2024-09-12 DIAGNOSIS — J45909 Unspecified asthma, uncomplicated: Secondary | ICD-10-CM

## 2024-09-12 DIAGNOSIS — G629 Polyneuropathy, unspecified: Secondary | ICD-10-CM

## 2024-09-12 DIAGNOSIS — E118 Type 2 diabetes mellitus with unspecified complications: Secondary | ICD-10-CM

## 2024-09-12 DIAGNOSIS — E1169 Type 2 diabetes mellitus with other specified complication: Secondary | ICD-10-CM

## 2024-09-12 DIAGNOSIS — E1159 Type 2 diabetes mellitus with other circulatory complications: Secondary | ICD-10-CM

## 2024-09-12 DIAGNOSIS — M1711 Unilateral primary osteoarthritis, right knee: Secondary | ICD-10-CM

## 2024-09-12 NOTE — Telephone Encounter (Unsigned)
 Copied from CRM #[id number]. Topic: Clinical - Medication Refill >> Sep 12, 2024  1:06 PM Drema MATSU wrote: Medication: albuterol  (VENTOLIN  HFA) 108 (90 Base) MCG/ACT inhaler, atorvastatin  (LIPITOR) 20 MG tablet, carvedilol  (COREG ) 6.25 MG tablet, DULoxetine  (CYMBALTA ) 20 MG capsule, gabapentin  (NEURONTIN ) 400 MG capsule, hydrochlorothiazide  (HYDRODIURIL ) 25 MG tablet, losartan  (COZAAR ) 100 MG tablet, meloxicam  (MOBIC ) 7.5 MG tablet, metFORMIN  (GLUCOPHAGE -XR) 500 MG 24 hr tablet, omeprazole  (PRILOSEC) 20 MG capsule, Semaglutide , 1 MG/DOSE, 4 MG/3ML SOPN, traZODone  (DESYREL ) 50 MG tablet  Has the patient contacted their pharmacy? Yes (Agent: If no, request that the patient contact the pharmacy for the refill. If patient does not wish to contact the pharmacy document the reason why and proceed with request.) call provider / need new prescription  (Agent: If yes, when and what did the pharmacy advise?)  This is the patient's preferred pharmacy:  [facility]  [address] [place], [place] [address] [phone] FAX: [phone]  Is this the correct pharmacy for this prescription? Yes If no, delete pharmacy and type the correct one.   Has the prescription been filled recently? No 90 DAY SUPPLY  Is the patient out of the medication? No  Has the patient been seen for an appointment in the last year OR does the patient have an upcoming appointment? Yes  Can we respond through MyChart? Yes  Agent: Please be advised that Rx refills may take up to 3 business days. We ask that you follow-up with your pharmacy.

## 2024-09-12 NOTE — Telephone Encounter (Signed)
 Left a VM for pt to call back.   E2C2, if pt calls, please schedule this appointment with them

## 2024-09-12 NOTE — Telephone Encounter (Unsigned)
 Copied from CRM [id number]. Topic: Clinical - Medication Refill >> Sep 12, 2024  1:24 PM Drema MATSU wrote: Medication: albuterol  (VENTOLIN  HFA) 108 (90 Base) MCG/ACT inhaler, atorvastatin  (LIPITOR) 20 MG tablet, carvedilol  (COREG ) 6.25 MG tablet, DULoxetine  (CYMBALTA ) 20 MG capsule, gabapentin  (NEURONTIN ) 400 MG capsule, hydrochlorothiazide  (HYDRODIURIL ) 25 MG tablet, losartan  (COZAAR ) 100 MG tablet, meloxicam  (MOBIC ) 7.5 MG tablet, metFORMIN  (GLUCOPHAGE -XR) 500 MG 24 hr tablet, omeprazole  (PRILOSEC) 20 MG capsule, Semaglutide , 1 MG/DOSE, 4 MG/3ML SOPN, traZODone  (DESYREL ) 50 MG tablet  Has the patient contacted their pharmacy? Yes (Agent: If no, request that the patient contact the pharmacy for the refill. If patient does not wish to contact the pharmacy document the reason why and proceed with request.) need new prescription (Agent: If yes, when and what did the pharmacy advise?)  This is the patient's preferred pharmacy:  [facility]  [address] [place], [place] [address] [phone] FAX: [phone]  Is this the correct pharmacy for this prescription? Yes If no, delete pharmacy and type the correct one.   Has the prescription been filled recently? No 90 day supply  Is the patient out of the medication? No  Has the patient been seen for an appointment in the last year OR does the patient have an upcoming appointment? Yes  Can we respond through MyChart? Yes  Agent: Please be advised that Rx refills may take up to 3 business days. We ask that you follow-up with your pharmacy.

## 2024-09-17 ENCOUNTER — Other Ambulatory Visit (HOSPITAL_COMMUNITY): Payer: Self-pay

## 2024-09-17 ENCOUNTER — Other Ambulatory Visit (HOSPITAL_BASED_OUTPATIENT_CLINIC_OR_DEPARTMENT_OTHER): Payer: Self-pay

## 2024-09-17 ENCOUNTER — Other Ambulatory Visit: Payer: Self-pay

## 2024-09-17 NOTE — Telephone Encounter (Signed)
 Spoke to pt. Please transfer call back to clinic.  Informed pt she should have meds for a year. Pt stated she's unsure of which pharmacy she's supposed to use. Was going to call pharmacy and see what's going on. I also spoke with pharmacy and they confirmed that pt has 2 refills left of all meds.

## 2024-09-23 ENCOUNTER — Ambulatory Visit (INDEPENDENT_AMBULATORY_CARE_PROVIDER_SITE_OTHER): Admitting: Internal Medicine

## 2024-09-23 ENCOUNTER — Other Ambulatory Visit: Payer: Self-pay

## 2024-09-23 ENCOUNTER — Other Ambulatory Visit (HOSPITAL_COMMUNITY): Payer: Self-pay

## 2024-09-23 ENCOUNTER — Encounter: Payer: Self-pay | Admitting: Internal Medicine

## 2024-09-23 VITALS — BP 120/80 | HR 77 | Temp 97.9°F | Ht 65.0 in | Wt 252.6 lb

## 2024-09-23 DIAGNOSIS — M1611 Unilateral primary osteoarthritis, right hip: Secondary | ICD-10-CM | POA: Diagnosis not present

## 2024-09-23 DIAGNOSIS — E1159 Type 2 diabetes mellitus with other circulatory complications: Secondary | ICD-10-CM

## 2024-09-23 DIAGNOSIS — Z6841 Body Mass Index (BMI) 40.0 and over, adult: Secondary | ICD-10-CM

## 2024-09-23 DIAGNOSIS — E118 Type 2 diabetes mellitus with unspecified complications: Secondary | ICD-10-CM

## 2024-09-23 DIAGNOSIS — E785 Hyperlipidemia, unspecified: Secondary | ICD-10-CM | POA: Diagnosis not present

## 2024-09-23 DIAGNOSIS — J45909 Unspecified asthma, uncomplicated: Secondary | ICD-10-CM

## 2024-09-23 DIAGNOSIS — E1169 Type 2 diabetes mellitus with other specified complication: Secondary | ICD-10-CM | POA: Diagnosis not present

## 2024-09-23 DIAGNOSIS — I152 Hypertension secondary to endocrine disorders: Secondary | ICD-10-CM

## 2024-09-23 LAB — MICROALBUMIN / CREATININE URINE RATIO
Creatinine,U: 206.4 mg/dL
Microalb Creat Ratio: 101.7 mg/g — ABNORMAL HIGH (ref 0.0–30.0)
Microalb, Ur: 21 mg/dL — ABNORMAL HIGH (ref 0.0–1.9)

## 2024-09-23 LAB — POCT GLYCOSYLATED HEMOGLOBIN (HGB A1C): Hemoglobin A1C: 5.4 % (ref 4.0–5.6)

## 2024-09-23 MED ORDER — MELOXICAM 7.5 MG PO TABS
7.5000 mg | ORAL_TABLET | Freq: Every day | ORAL | 2 refills | Status: AC | PRN
  Filled 2024-09-23: qty 30, 30d supply, fill #0
  Filled 2024-12-19: qty 30, 30d supply, fill #1

## 2024-09-23 MED ORDER — BLOOD GLUCOSE MONITOR SYSTEM W/DEVICE KIT
1.0000 | PACK | 0 refills | Status: AC
  Filled 2024-09-23: qty 1, 30d supply, fill #0

## 2024-09-23 MED ORDER — BLOOD GLUCOSE TEST VI STRP
1.0000 | ORAL_STRIP | 0 refills | Status: DC
  Filled 2024-09-23: qty 100, 25d supply, fill #0

## 2024-09-23 MED ORDER — LANCET DEVICE MISC
1.0000 | 0 refills | Status: AC
  Filled 2024-09-23 – 2024-12-19 (×2): qty 1, fill #0

## 2024-09-23 MED ORDER — LANCETS MISC
1.0000 | 0 refills | Status: DC
  Filled 2024-09-23: qty 100, 25d supply, fill #0

## 2024-09-23 MED ORDER — ALBUTEROL-BUDESONIDE 90-80 MCG/ACT IN AERO
1.0000 | INHALATION_SPRAY | Freq: Two times a day (BID) | RESPIRATORY_TRACT | 3 refills | Status: AC | PRN
  Filled 2024-09-23: qty 10.7, 30d supply, fill #0
  Filled 2025-01-01: qty 10.7, 30d supply, fill #1

## 2024-09-23 NOTE — Assessment & Plan Note (Signed)
-   This problem is chronic and stable -Patient states that she is on albuterol  as needed for her asthma and that this is helpful but she did trial Airsupra  earlier this year and states that that was more effective than the albuterol  alone -Given that she does have a history of asthma she would benefit from an inhaler which has both albuterol  as well as a steroid -I have prescribed albuterol /budesonide  for the patient to take 1 puff twice daily as needed -No further workup at this time

## 2024-09-23 NOTE — Assessment & Plan Note (Signed)
-   This problem is chronic and stable -Patient states that she has right hip pain chronically and has been using Tylenol to help with her pain.  It has been somewhat effective but meloxicam  works better for her -I explained to the patient meloxicam  can cause kidney issues as well as ulcers and given that she has coexisting diabetes and hypertension this is not a medication that she should be using daily -However, I have prescribed meloxicam  for her to use as needed when her pain is uncontrolled on the Tylenol.  She states that she will use this sparingly for severe pain as needed -Patient still needs to lose 10 more pounds before she can get a hip replacement -Will continue with semaglutide  for this -No further workup at this time

## 2024-09-23 NOTE — Assessment & Plan Note (Addendum)
-   Patient has a history of type 2 diabetes associated with hypertension and morbid obesity -Patient's A1c is well-controlled with an A1c of 5.4 today -Patient is currently on metformin  and semaglutide  -Patient has lost approximately 20 pounds since her last visit -Will continue with Ozempic  1 mg weekly and consider titrating up to 2 mg weekly at her next appointment.  Prior authorization for Ozempic  has been sent -Will also consider discontinuing metformin  and we titrate up her Ozempic  -I have prescribed a blood glucose monitor for the patient today as well -Patient is due for diabetic eye exam -No further workup at this time

## 2024-09-23 NOTE — Assessment & Plan Note (Signed)
-   This problem is chronic and stable -Patient blood pressure is well-controlled today at 120/80 -We will continue with current medication regimen including amlodipine  2.5 mg daily, carvedilol  6.25 mg twice daily, HCTZ 25 mg daily and losartan  100 mg daily -Patient had recent BMP earlier this year.  No indication repeated at this time -I did explain to the patient that as she continues to lose weight that she may be able to come off some of these medications and to monitor for signs of lightheadedness or dizziness as this may be a sign of low blood pressure -I also instructed the patient to get a blood pressure cuff to monitor her blood pressure at home

## 2024-09-23 NOTE — Assessment & Plan Note (Signed)
-   This problem is chronic and stable -Patient patient had a lipid panel earlier this year which showed an LDL of 79 and HDL of 76 with normal triglycerides -Will continue with atorvastatin  20 mg daily - No further workup at this time

## 2024-09-23 NOTE — Patient Instructions (Signed)
  VISIT SUMMARY: During today's visit, we discussed your joint pain, asthma, diabetes, hypertension, and carpal tunnel syndrome. We reviewed your current medications and made some adjustments to better manage your conditions. We also addressed your concerns about medication side effects and provided recommendations for general health maintenance.  YOUR PLAN: -RIGHT HIP OSTEOARTHRITIS WITH SEVERE PAIN: Osteoarthritis is a condition where the cartilage in your joints wears down, causing pain and swelling. You should continue using acetaminophen for pain management and take meloxicam  occasionally for severe pain, but not daily, to avoid kidney issues.  -GENERALIZED OSTEOARTHRITIS WITH POLYARTHRALGIA: This condition involves pain in multiple joints. Continue using acetaminophen for general pain management and take meloxicam  occasionally for severe pain, but not daily.  -TYPE 2 DIABETES MELLITUS: Diabetes is a condition where your blood sugar levels are too high. Your diabetes is well-managed with metformin  and semaglutide . We will recheck your A1c today, and if it remains low, we may discontinue metformin  and continue semaglutide . You will also receive a prescription for blood glucose monitoring supplies.  -HYPERTENSION: Hypertension is high blood pressure. Your blood pressure is well-controlled with your current medications. As you continue to lose weight, monitor for symptoms of low blood pressure. You will receive a prescription for a home blood pressure monitor.  -ASTHMA: Asthma is a condition that makes it hard to breathe due to inflamed airways. You will continue using your albuterol  inhaler as needed. We will prescribe a steroid inhaler for as-needed use and check your insurance coverage for it.  -OBESITY: Obesity is having excess body weight. You are close to your weight loss goal for hip surgery, and semaglutide  is helping with this. Continue using semaglutide  to aid in weight loss.  -GENERAL  HEALTH MAINTENANCE: You are at high risk for respiratory infections due to your age and medical conditions. You received a flu shot today and are recommended to get a COVID-19 vaccination at the pharmacy.  INSTRUCTIONS: Please follow up with us  after your A1c results come in. Continue with your current medications and lifestyle changes. Use the prescribed medications as directed and monitor your blood pressure and blood sugar at home. Schedule your hip surgery once you reach your weight loss goal. Get the COVID-19 vaccination at your local pharmacy.                      Contains text generated by [employer]bridge.                                 Contains text generated by [employer]bridge.

## 2024-09-23 NOTE — Progress Notes (Signed)
 Acute Office Visit  Subjective:     Patient ID: Judy Porter, female    DOB: 1951-11-12, 73 y.o.   MRN: [id number]  Chief Complaint  Patient presents with   Medication Refill   Discussed the use of AI scribe software for clinical note transcription with the patient, who gave verbal consent to proceed.  History of Present Illness Judy Porter is a 73 year old female with diabetes and hypertension who presents with joint pain and asthma management.  Articular pain and swelling - Severe joint pain, predominantly in the right hip, which appears enlarged compared to the left - Awaiting hip surgery; requires a ten-pound weight loss to qualify for the procedure - Pain also present in elbows and other joints - Pain management with acetaminophen 650 mg: twice in the morning, once in the afternoon, and twice at night - Previously treated with meloxicam , discontinued due to concerns about renal irritation; has not taken meloxicam  for several months - Alternative pain medication trialed but ineffective  Asthma symptoms and management - Asthma exacerbations worsen during winter months, leading to avoidance of outdoor activities - Uses albuterol  inhaler as needed - Received a sample of another inhaler that was effective; unable to recall the name and concerned about insurance coverage for this medication  Diabetes mellitus management - Diabetes managed with metformin  500 mg daily and semaglutide  1 mg weekly - No adverse effects from current diabetes medications - Semaglutide  effective in appetite suppression - Seeking assistance with obtaining a glucose monitoring kit due to financial constraints  Hypertension management - Hypertension managed with amlodipine , hydrochlorothiazide , losartan , and carvedilol  - Blood pressure currently well-controlled - Does not have a blood pressure cuff at home  Carpal tunnel syndrome - Experiences numbness in both hands attributed to carpal tunnel  syndrome  Renal concerns related to medication use - Family history of kidney failure; husband died from this condition - Expresses caution regarding medications that may affect renal function     Review of Systems  Constitutional: Negative.   HENT: Negative.    Cardiovascular: Negative.   Gastrointestinal: Negative.   Musculoskeletal:  Positive for joint pain.       Patient complains of right hip pain  secondary osteoarthritis  Neurological:  Positive for tingling.       Complains of tingling and numbness in bilateral fingers attributed to carpal tunnel syndrome  Psychiatric/Behavioral: Negative.          Objective:    BP 120/80   Pulse 77   Temp 97.9 F (36.6 C)   Ht 5' 5 (1.651 m)   Wt 252 lb 9.6 oz (114.6 kg)   LMP  (LMP Unknown)   SpO2 97%   BMI 42.03 kg/m    Physical Exam Constitutional:      Appearance: Normal appearance.  HENT:     Head: Normocephalic and atraumatic.  Cardiovascular:     Rate and Rhythm: Normal rate and regular rhythm.     Heart sounds: Normal heart sounds.  Pulmonary:     Effort: Pulmonary effort is normal.     Breath sounds: Normal breath sounds. No wheezing, rhonchi or rales.  Abdominal:     General: Bowel sounds are normal. There is no distension.     Palpations: Abdomen is soft.     Tenderness: There is no abdominal tenderness. There is no guarding or rebound.  Musculoskeletal:        General: No swelling or tenderness.     Right lower leg: No edema.  Left lower leg: No edema.  Neurological:     Mental Status: She is alert.  Psychiatric:        Mood and Affect: Mood normal.        Behavior: Behavior normal.     Results for orders placed or performed in visit on 09/23/24  POCT glycosylated hemoglobin (Hb A1C)  Result Value Ref Range   Hemoglobin A1C 5.4 4.0 - 5.6 %   HbA1c POC (<> result, manual entry)     HbA1c, POC (prediabetic range)     HbA1c, POC (controlled diabetic range)          Assessment & Plan:    Problem List Items Addressed This Visit       Cardiovascular and Mediastinum   Hypertension associated with diabetes (HCC)   - This problem is chronic and stable -Patient blood pressure is well-controlled today at 120/80 -We will continue with current medication regimen including amlodipine  2.5 mg daily, carvedilol  6.25 mg twice daily, HCTZ 25 mg daily and losartan  100 mg daily -Patient had recent BMP earlier this year.  No indication repeated at this time -I did explain to the patient that as she continues to lose weight that she may be able to come off some of these medications and to monitor for signs of lightheadedness or dizziness as this may be a sign of low blood pressure -I also instructed the patient to get a blood pressure cuff to monitor her blood pressure at home        Respiratory   Asthma   - This problem is chronic and stable -Patient states that she is on albuterol  as needed for her asthma and that this is helpful but she did trial Airsupra  earlier this year and states that that was more effective than the albuterol  alone -Given that she does have a history of asthma she would benefit from an inhaler which has both albuterol  as well as a steroid -I have prescribed albuterol /budesonide  for the patient to take 1 puff twice daily as needed -No further workup at this time       Relevant Medications   Albuterol -Budesonide  90-80 MCG/ACT AERO     Endocrine   Hyperlipidemia associated with type 2 diabetes mellitus (HCC) - Primary   - This problem is chronic and stable -Patient patient had a lipid panel earlier this year which showed an LDL of 79 and HDL of 76 with normal triglycerides -Will continue with atorvastatin  20 mg daily - No further workup at this time      Type 2 diabetes with complication (HCC)   - Patient has a history of type 2 diabetes associated with hypertension and morbid obesity -Patient's A1c is well-controlled with an A1c of 5.4 today -Patient is  currently on metformin  and semaglutide  -Patient has lost approximately 20 pounds since her last visit -Will continue with Ozempic  1 mg weekly and consider titrating up to 2 mg weekly at her next appointment.  Prior authorization for Ozempic  has been sent -Will also consider discontinuing metformin  and we titrate up her Ozempic  -I have prescribed a blood glucose monitor for the patient today as well -Patient is due for diabetic eye exam -No further workup at this time      Relevant Medications   Blood Glucose Monitoring Suppl (BLOOD GLUCOSE MONITOR SYSTEM) w/Device KIT   Glucose Blood (BLOOD GLUCOSE TEST STRIPS) STRP   Lancet Device MISC   Lancets MISC   Other Relevant Orders   Microalbumin / creatinine urine  ratio   POCT glycosylated hemoglobin (Hb A1C) (Completed)     Musculoskeletal and Integument   Osteoarthritis of right hip   - This problem is chronic and stable -Patient states that she has right hip pain chronically and has been using Tylenol to help with her pain.  It has been somewhat effective but meloxicam  works better for her -I explained to the patient meloxicam  can cause kidney issues as well as ulcers and given that she has coexisting diabetes and hypertension this is not a medication that she should be using daily -However, I have prescribed meloxicam  for her to use as needed when her pain is uncontrolled on the Tylenol.  She states that she will use this sparingly for severe pain as needed -Patient still needs to lose 10 more pounds before she can get a hip replacement -Will continue with semaglutide  for this -No further workup at this time      Relevant Medications   meloxicam  (MOBIC ) 7.5 MG tablet     Other   Morbid obesity (HCC)   - This problem is chronic and improving -She has done approximately 19 pounds since her last visit -We will continue with Ozempic  1 mg weekly and consider titrating it up to 2 mg weekly -Patient to continue with diet and exercise -No  further workup at this time       Meds ordered this encounter  Medications   Albuterol -Budesonide  90-80 MCG/ACT AERO    Sig: Inhale 1 puff into the lungs 2 (two) times daily as needed.    Dispense:  10.7 g    Refill:  3   Blood Glucose Monitoring Suppl (BLOOD GLUCOSE MONITOR SYSTEM) w/Device KIT    Sig: Use up to four times daily as directed.    Dispense:  1 kit    Refill:  0   Glucose Blood (BLOOD GLUCOSE TEST STRIPS) STRP    Sig: Use up to four times daily as directed.    Dispense:  100 strip    Refill:  0   Lancet Device MISC    Sig: 1 each by Does not apply route as directed. Dispense based on patient and insurance preference. Use up to four times daily as directed. (FOR ICD-10 E10.9, E11.9).    Dispense:  1 each    Refill:  0   Lancets MISC    Sig: Use up to four times daily as directed.    Dispense:  100 each    Refill:  0   meloxicam  (MOBIC ) 7.5 MG tablet    Sig: Take 1 tablet (7.5 mg total) by mouth daily as needed for pain.    Dispense:  30 tablet    Refill:  2    No follow-ups on file.  Kyndel Egger, MD

## 2024-09-23 NOTE — Assessment & Plan Note (Signed)
-   This problem is chronic and improving -She has done approximately 19 pounds since her last visit -We will continue with Ozempic  1 mg weekly and consider titrating it up to 2 mg weekly -Patient to continue with diet and exercise -No further workup at this time

## 2024-09-24 ENCOUNTER — Ambulatory Visit: Payer: Self-pay | Admitting: Internal Medicine

## 2024-09-24 ENCOUNTER — Telehealth: Payer: Self-pay

## 2024-09-24 ENCOUNTER — Other Ambulatory Visit: Payer: Self-pay

## 2024-09-24 ENCOUNTER — Other Ambulatory Visit (HOSPITAL_COMMUNITY): Payer: Self-pay

## 2024-09-24 DIAGNOSIS — E118 Type 2 diabetes mellitus with unspecified complications: Secondary | ICD-10-CM

## 2024-09-24 MED ORDER — EMPAGLIFLOZIN 10 MG PO TABS
10.0000 mg | ORAL_TABLET | Freq: Every day | ORAL | 0 refills | Status: DC
  Filled 2024-09-24: qty 90, 90d supply, fill #0

## 2024-09-24 NOTE — Assessment & Plan Note (Signed)
-   Patient is noted has an elevated urine microalbumin creatinine ratio (101) -Will DC her metformin  and start her on Jardiance 10 mg daily -Will attempt to titrate up her Jardiance at her next visit to 25 mg -Continue with Ozempic  1 mg weekly -Would DC metformin  given that we started her on Jardiance -No further workup at this time

## 2024-09-24 NOTE — Telephone Encounter (Signed)
 Copied from CRM [id number]. Topic: General - Inquiry >> Sep 24, 2024  3:38 PM Farrel B wrote: Reason for CRM: Ms. Judy Porter called from [facility] [phone], [facility] provider services, she stated that the patient had been referred to [facility] for assistance for her care working along with medicare but they were needing proof of medical conditions and requested that I provide them with that information. I advise the representative to send over a fax requesting the patient medical information she stated the fax should be received within the next 24 hours

## 2024-09-24 NOTE — Telephone Encounter (Signed)
 Received fax from [facility] and placed in Dr. Yates needs to be reviewed and signed folder.

## 2024-10-23 ENCOUNTER — Ambulatory Visit: Admitting: Internal Medicine

## 2024-10-23 DIAGNOSIS — E1159 Type 2 diabetes mellitus with other circulatory complications: Secondary | ICD-10-CM

## 2024-10-23 DIAGNOSIS — E118 Type 2 diabetes mellitus with unspecified complications: Secondary | ICD-10-CM

## 2024-12-18 ENCOUNTER — Other Ambulatory Visit: Payer: Self-pay | Admitting: Internal Medicine

## 2024-12-18 ENCOUNTER — Other Ambulatory Visit (HOSPITAL_COMMUNITY): Payer: Self-pay

## 2024-12-18 DIAGNOSIS — E118 Type 2 diabetes mellitus with unspecified complications: Secondary | ICD-10-CM

## 2024-12-18 MED ORDER — EMPAGLIFLOZIN 10 MG PO TABS
10.0000 mg | ORAL_TABLET | Freq: Every day | ORAL | 0 refills | Status: AC
  Filled 2024-12-18: qty 90, 90d supply, fill #0

## 2024-12-18 MED ORDER — LANCETS MISC
1.0000 | 0 refills | Status: AC
  Filled 2024-12-18: qty 100, 25d supply, fill #0

## 2024-12-18 MED ORDER — BLOOD GLUCOSE TEST VI STRP
1.0000 | ORAL_STRIP | 0 refills | Status: AC
  Filled 2024-12-18: qty 100, 25d supply, fill #0

## 2024-12-19 ENCOUNTER — Other Ambulatory Visit: Payer: Self-pay

## 2024-12-19 ENCOUNTER — Other Ambulatory Visit (HOSPITAL_COMMUNITY): Payer: Self-pay

## 2024-12-20 ENCOUNTER — Other Ambulatory Visit: Payer: Self-pay

## 2024-12-23 ENCOUNTER — Other Ambulatory Visit (HOSPITAL_COMMUNITY): Payer: Self-pay

## 2024-12-24 ENCOUNTER — Other Ambulatory Visit: Payer: Self-pay

## 2024-12-30 ENCOUNTER — Other Ambulatory Visit: Payer: Self-pay

## 2024-12-30 ENCOUNTER — Other Ambulatory Visit (HOSPITAL_COMMUNITY): Payer: Self-pay

## 2024-12-30 ENCOUNTER — Other Ambulatory Visit (HOSPITAL_BASED_OUTPATIENT_CLINIC_OR_DEPARTMENT_OTHER): Payer: Self-pay

## 2025-01-01 ENCOUNTER — Other Ambulatory Visit (HOSPITAL_COMMUNITY): Payer: Self-pay

## 2025-01-01 ENCOUNTER — Telehealth: Payer: Self-pay

## 2025-01-01 ENCOUNTER — Other Ambulatory Visit: Payer: Self-pay

## 2025-01-01 NOTE — Telephone Encounter (Signed)
 Called the Patient and explained why the Pharmacy is trying to send her medication that she said the provider stopped in June 25. I let the Patient know that it is probably due to having refills on that medication and that medication is still active in her chart due to her not having an appointment recently. Patient states thank you.

## 2025-01-01 NOTE — Telephone Encounter (Signed)
 Copied from CRM [id number]. Topic: Clinical - Medication Question >> Jan 01, 2025 12:56 PM Mercedes MATSU wrote: Reason for CRM: Patient called in with a rep on the line because she has questions about a medication. Patient or the rep did not disclose what it was in regards too the medication.

## 2025-01-02 ENCOUNTER — Other Ambulatory Visit: Payer: Self-pay

## 2025-01-03 ENCOUNTER — Other Ambulatory Visit: Payer: Self-pay

## 2025-01-06 ENCOUNTER — Other Ambulatory Visit (HOSPITAL_COMMUNITY): Payer: Self-pay

## 2025-01-08 ENCOUNTER — Other Ambulatory Visit: Payer: Self-pay

## 2025-01-21 ENCOUNTER — Encounter: Admitting: Nurse Practitioner

## 2025-02-10 ENCOUNTER — Encounter

## 2025-02-18 ENCOUNTER — Ambulatory Visit: Payer: Self-pay
